# Patient Record
Sex: Female | Born: 1945 | Race: White | Hispanic: No | Marital: Single | Smoking: Never smoker
Health system: Southern US, Community
[De-identification: ages and names within clinical notes are randomized; demographics above are authoritative.]

## PROBLEM LIST (undated history)

## (undated) DIAGNOSIS — M1990 Unspecified osteoarthritis, unspecified site: Secondary | ICD-10-CM

## (undated) DIAGNOSIS — I10 Essential (primary) hypertension: Secondary | ICD-10-CM

## (undated) DIAGNOSIS — E785 Hyperlipidemia, unspecified: Secondary | ICD-10-CM

## (undated) DIAGNOSIS — S83249A Other tear of medial meniscus, current injury, unspecified knee, initial encounter: Secondary | ICD-10-CM

## (undated) HISTORY — PX: NO PAST SURGERIES: SHX2092

---

## 2012-10-01 DIAGNOSIS — E559 Vitamin D deficiency, unspecified: Secondary | ICD-10-CM | POA: Insufficient documentation

## 2013-07-29 DIAGNOSIS — E785 Hyperlipidemia, unspecified: Secondary | ICD-10-CM | POA: Insufficient documentation

## 2013-07-29 DIAGNOSIS — K219 Gastro-esophageal reflux disease without esophagitis: Secondary | ICD-10-CM | POA: Insufficient documentation

## 2013-07-29 DIAGNOSIS — R0989 Other specified symptoms and signs involving the circulatory and respiratory systems: Secondary | ICD-10-CM | POA: Insufficient documentation

## 2013-07-29 DIAGNOSIS — R1013 Epigastric pain: Secondary | ICD-10-CM | POA: Insufficient documentation

## 2013-07-29 DIAGNOSIS — F5101 Primary insomnia: Secondary | ICD-10-CM | POA: Insufficient documentation

## 2014-06-30 DIAGNOSIS — R55 Syncope and collapse: Secondary | ICD-10-CM | POA: Insufficient documentation

## 2014-06-30 DIAGNOSIS — R5383 Other fatigue: Secondary | ICD-10-CM | POA: Insufficient documentation

## 2016-10-24 ENCOUNTER — Ambulatory Visit (INDEPENDENT_AMBULATORY_CARE_PROVIDER_SITE_OTHER): Payer: Medicare Other

## 2016-10-24 ENCOUNTER — Encounter: Payer: Self-pay | Admitting: Osteopathic Medicine

## 2016-10-24 ENCOUNTER — Ambulatory Visit (INDEPENDENT_AMBULATORY_CARE_PROVIDER_SITE_OTHER): Payer: Medicare Other | Admitting: Osteopathic Medicine

## 2016-10-24 VITALS — BP 184/91 | HR 91 | Ht 61.0 in | Wt 160.0 lb

## 2016-10-24 DIAGNOSIS — R140 Abdominal distension (gaseous): Secondary | ICD-10-CM | POA: Diagnosis not present

## 2016-10-24 DIAGNOSIS — I10 Essential (primary) hypertension: Secondary | ICD-10-CM | POA: Diagnosis not present

## 2016-10-24 DIAGNOSIS — R82998 Other abnormal findings in urine: Secondary | ICD-10-CM | POA: Diagnosis not present

## 2016-10-24 DIAGNOSIS — G8929 Other chronic pain: Secondary | ICD-10-CM | POA: Diagnosis not present

## 2016-10-24 DIAGNOSIS — R109 Unspecified abdominal pain: Secondary | ICD-10-CM

## 2016-10-24 DIAGNOSIS — M545 Low back pain, unspecified: Secondary | ICD-10-CM

## 2016-10-24 LAB — CBC
HEMATOCRIT: 37.2 % (ref 35.0–45.0)
Hemoglobin: 12.7 g/dL (ref 11.7–15.5)
MCH: 31.2 pg (ref 27.0–33.0)
MCHC: 34.1 g/dL (ref 32.0–36.0)
MCV: 91.4 fL (ref 80.0–100.0)
MPV: 9.7 fL (ref 7.5–12.5)
PLATELETS: 237 10*3/uL (ref 140–400)
RBC: 4.07 10*6/uL (ref 3.80–5.10)
RDW: 11.6 % (ref 11.0–15.0)
WBC: 6.4 10*3/uL (ref 3.8–10.8)

## 2016-10-24 LAB — COMPLETE METABOLIC PANEL WITH GFR
AG RATIO: 1.4 (calc) (ref 1.0–2.5)
ALT: 30 U/L — AB (ref 6–29)
AST: 23 U/L (ref 10–35)
Albumin: 4.3 g/dL (ref 3.6–5.1)
Alkaline phosphatase (APISO): 82 U/L (ref 33–130)
BILIRUBIN TOTAL: 1.1 mg/dL (ref 0.2–1.2)
BUN: 10 mg/dL (ref 7–25)
CALCIUM: 9.5 mg/dL (ref 8.6–10.4)
CHLORIDE: 104 mmol/L (ref 98–110)
CO2: 30 mmol/L (ref 20–32)
Creat: 0.74 mg/dL (ref 0.50–0.99)
GFR, EST AFRICAN AMERICAN: 96 mL/min/{1.73_m2} (ref >=60)
GFR, EST NON AFRICAN AMERICAN: 83 mL/min/{1.73_m2} (ref >=60)
GLOBULIN: 3 g/dL (ref 1.9–3.7)
Glucose, Bld: 106 mg/dL — ABNORMAL HIGH (ref 65–99)
POTASSIUM: 4.3 mmol/L (ref 3.5–5.3)
SODIUM: 141 mmol/L (ref 135–146)
Total Protein: 7.3 g/dL (ref 6.1–8.1)

## 2016-10-24 LAB — URINALYSIS, ROUTINE W REFLEX MICROSCOPIC
BACTERIA UA: NONE SEEN /HPF
BILIRUBIN URINE: NEGATIVE
Glucose, UA: NEGATIVE
HGB URINE DIPSTICK: NEGATIVE
KETONES UR: NEGATIVE
NITRITE: NEGATIVE
Protein, ur: NEGATIVE
Specific Gravity, Urine: 1.018 (ref 1.001–1.03)
pH: 7 (ref 5.0–8.0)

## 2016-10-24 LAB — LIPID PANEL
Cholesterol: 264 mg/dL — ABNORMAL HIGH (ref <200)
HDL: 52 mg/dL (ref >50)
LDL Cholesterol (Calc): 185 mg/dL (calc) — ABNORMAL HIGH
NON-HDL CHOLESTEROL (CALC): 212 mg/dL — AB (ref <130)
Total CHOL/HDL Ratio: 5.1 (calc) — ABNORMAL HIGH (ref <5.0)
Triglycerides: 137 mg/dL (ref <150)

## 2016-10-24 LAB — TSH: TSH: 1.04 m[IU]/L (ref 0.40–4.50)

## 2016-10-24 LAB — LIPASE: Lipase: 25 U/L (ref 7–60)

## 2016-10-24 IMAGING — DX DG ABDOMEN 2V
3 series · 3 of 3 positions shown · non-contrast
Comparison: None.

CLINICAL DATA: Abdominal cramping, bloating for 3-4 month

EXAM:
ABDOMEN - 2 VIEW

[abdomen erect]
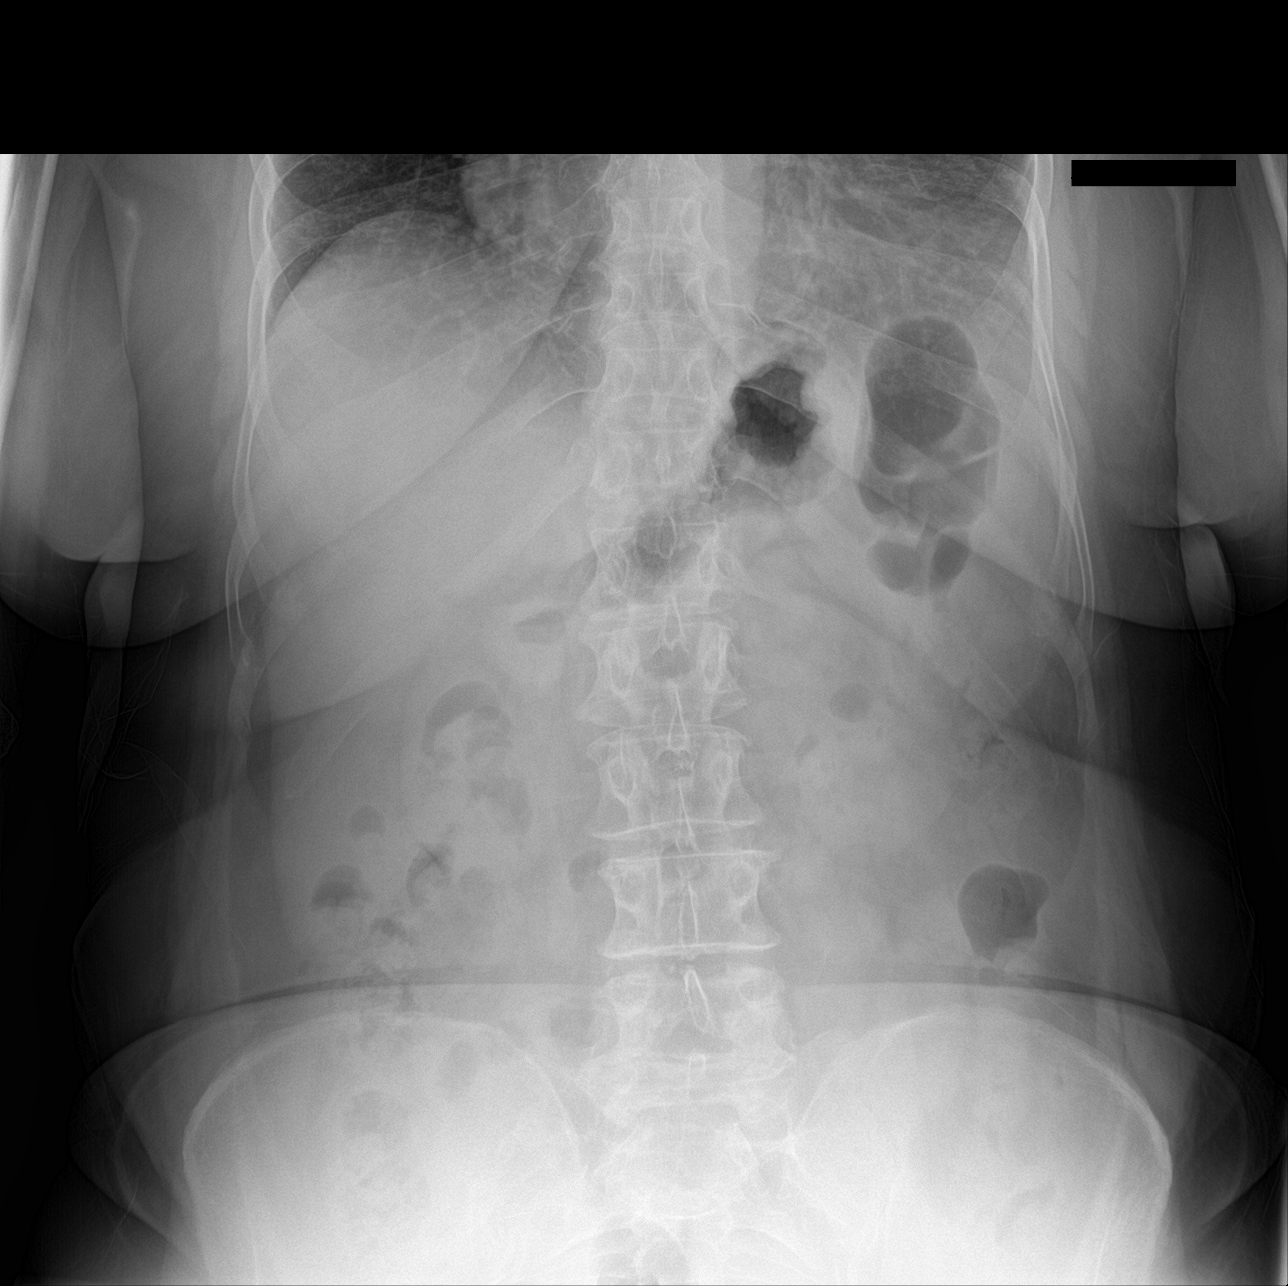

[abdomen supine (1 of 2)]
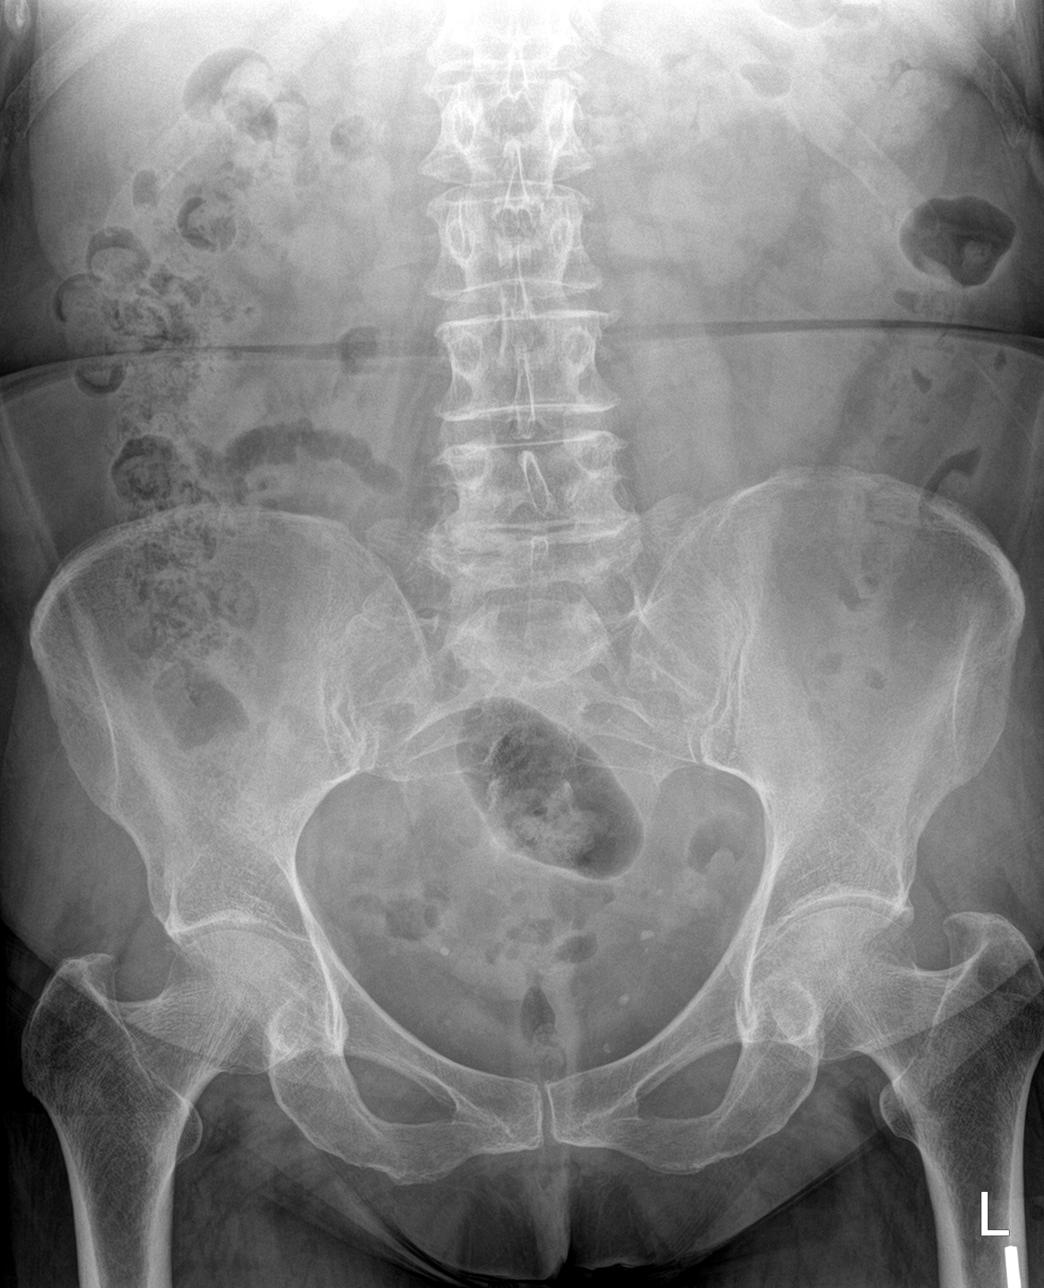

[abdomen supine (2 of 2)]
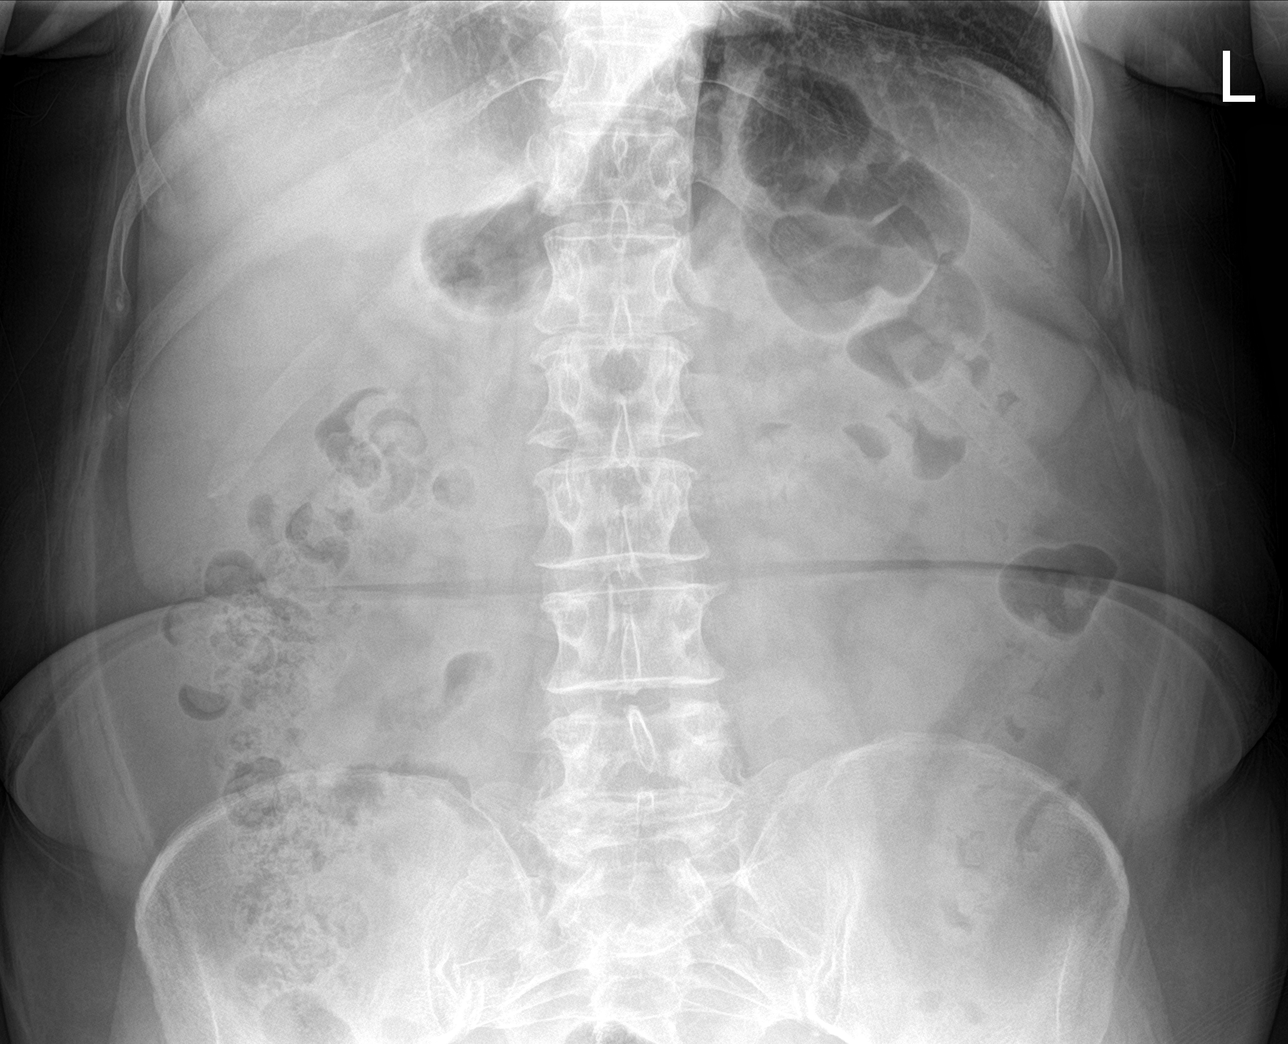

[3 of 3 positions shown; findings below may reference images not displayed]

FINDINGS: Supine and erect views the abdomen show no bowel obstruction. There
is a moderate amount of feces throughout the colon. No free
intraperitoneal air is seen. No opaque calculi are noted. No acute
bony abnormality is seen.
IMPRESSION: 1. No bowel obstruction.  No free air.
2. Moderate amount of feces throughout the colon.

## 2016-10-24 MED ORDER — HYDROCHLOROTHIAZIDE 25 MG PO TABS: 25.0000 mg | ORAL_TABLET | Freq: Every day | ORAL | 1 refills | Status: DC

## 2016-10-24 MED ORDER — MELOXICAM 15 MG PO TABS: 15.0000 mg | ORAL_TABLET | Freq: Every day | ORAL | 2 refills | Status: DC

## 2016-10-24 MED ORDER — DICYCLOMINE HCL 20 MG PO TABS: 20.0000 mg | ORAL_TABLET | Freq: Four times a day (QID) | ORAL | 1 refills | Status: DC

## 2016-10-24 MED ORDER — AMLODIPINE BESYLATE 10 MG PO TABS: 10.0000 mg | ORAL_TABLET | Freq: Every day | ORAL | 1 refills | Status: DC

## 2016-10-24 NOTE — Patient Instructions (Addendum)
Plan:  Back pain: Home exercises and stop ibuprofen, start meloxicam daily  Abdominal pain: We'll get x-ray today, see list below of foods to avoid, trial medication for abdominal spasm as needed. Will consider CT scan if symptoms are not improving  Colon cancer screening: Think about whether you would like to do colonoscopy versus Cologuard testing - you are overdue for routine colon cancer screening. I don't think this has anything to do with your abdominal symptoms now, but we should certainly rule it out  High blood pressure: I'm going to restart medications, we'll plan to recheck blood pressure in 2 weeks. Based on previous records, you are on several different blood pressure medications. I'm going to restart amlodipine and hydrochlorothiazide, start with one half tablet of these 2 medications for the first 3-5 days or so, then increase to full tablet. If you experience dizziness or weakness, please call me right away  Labs today  X-ray today

## 2016-10-24 NOTE — Progress Notes (Signed)
HPI: Jill Bernard is a 71 y.o. female  who [facility], 10/24/16,  for chief complaint of:  Chief Complaint  Patient presents with  . Establish Care    BACK ACHE AND ABDOMINAL PAIN    Concern for mid abdominal pain radiating around into the back. History of chronic back issues, coming go on occasion. Reports cramping type abdominal pain. I'm going on and off, worse over the past few days.  Reports occasional orange stool on toilet tissue but no gross blood in stool or dark black or tarry stool.  Concern for weight gain.  Occasional headaches, history of hypertension, blood pressure has gone untreated for some time. She also has a history of glaucoma andthat she needs follow-up with ophthalmologist but hasn't done this in some time.    Patient is accompanied by sister who assists with history-taking.   Past medical, surgical, social and family history reviewed: There are no active problems to display for this patient.  No past surgical history on file. Social History  Substance Use Topics  . Smoking status: Never Smoker  . Smokeless tobacco: Never Used  . Alcohol use Not on file   Family History  Problem Relation Age of Onset  . Hypertension Father   . Diabetes Maternal Grandmother   . Diabetes Maternal Grandfather      Current medication list and allergy/intolerance information reviewed:   No current outpatient prescriptions on file.   No current facility-administered medications for this visit.    Allergies  Allergen Reactions  . Latex Other (See Comments)    Unknown  . Lisinopril Cough      Review of Systems:  Constitutional:  No  fever, no chills, No recent illness, +unintentional weight gain. No significant fatigue.   HEENT: + headache, no hearing change, No sore throat, No  sinus pressure  Cardiac: No  chest pain, No  pressure, No palpitations, No  Orthopnea  Respiratory:  No  shortness of breath.  No  Cough  Gastrointestinal: +abdominal pain, No  nausea, No  vomiting,  No  blood in stool, No  diarrhea, No  constipation   Musculoskeletal: No new myalgia/arthralgia  Genitourinary: No  incontinence, No  abnormal genital bleeding, No abnormal genital discharge  Skin: No  Rash, No other wounds/concerning lesions  Hem/Onc: +easy bruising  Endocrine: No cold intolerance,  No heat intolerance.  Neurologic: No  weakness, No  dizziness  Psychiatric: No  concerns with depression, No  concerns with anxiety, +sleep problems, No mood problems  Exam:  BP (!) 184/91   Pulse 91   Ht 5\' 1"  (1.549 m)   Wt 160 lb (72.6 kg)   BMI 30.23 kg/m   Constitutional: VS see above. General Appearance: alert, well-developed, well-nourished, NAD  Eyes: Normal lids and conjunctive, non-icteric sclera  Ears, Nose, Mouth, Throat: MMM, Normal external inspection ears/nares/mouth/lips/gums.   Neck: No masses, trachea midline. No thyroid enlargement. No tenderness/mass appreciated. No lymphadenopathy  Respiratory: Normal respiratory effort. no wheeze, no rhonchi, no rales  Cardiovascular: S1/S2 normal, no murmur, no rub/gallop auscultated. RRR. No lower extremity edema.   Gastrointestinal: Mild tender to palpation epigastric region otherwise no tenderness, no masses though habitus limits exam somewhat. No hepatomegaly, no splenomegaly. No hernia appreciated. Bowel sounds normal. Rectal exam deferred.   Musculoskeletal: Gait normal. Straight leg raise negative bilaterally.  Neurological: Normal balance/coordination. No tremor.   Skin: warm, dry, intact. No rash/ulcer.     Psychiatric: Normal judgment/insight. Normal mood  and affect. Oriented x3.    Results for orders placed or performed in visit on 10/24/16 (from the past 72 hour(s))  CBC     Status: None   Collection Time: 10/24/16 11:05 AM  Result Value Ref Range   WBC 6.4 3.8 - 10.8 Thousand/uL   RBC 4.07 3.80 - 5.10 Million/uL   Hemoglobin  12.7 11.7 - 15.5 g/dL   HCT 37.2 35.0 - 45.0 %   MCV 91.4 80.0 - 100.0 fL   MCH 31.2 27.0 - 33.0 pg   MCHC 34.1 32.0 - 36.0 g/dL   RDW 11.6 11.0 - 15.0 %   Platelets 237 140 - 400 Thousand/uL   MPV 9.7 7.5 - 12.5 fL  COMPLETE METABOLIC PANEL WITH GFR     Status: Abnormal   Collection Time: 10/24/16 11:05 AM  Result Value Ref Range   Glucose, Bld 106 (H) 65 - 99 mg/dL    Comment: .            Fasting reference interval . For someone without known diabetes, a glucose value between 100 and 125 mg/dL is consistent with prediabetes and should be confirmed with a follow-up test. .    BUN 10 7 - 25 mg/dL   Creat 0.74 0.50 - 0.99 mg/dL    Comment: For patients >77 years of age, the reference limit for Creatinine is approximately 13% higher for people identified as African-American. .    GFR, Est Non African American 83 > OR = 60 mL/min/1.75m2   GFR, Est African American 96 > OR = 60 mL/min/1.54m2   BUN/Creatinine Ratio NOT APPLICABLE 6 - 22 (calc)   Sodium 141 135 - 146 mmol/L   Potassium 4.3 3.5 - 5.3 mmol/L   Chloride 104 98 - 110 mmol/L   CO2 30 20 - 32 mmol/L   Calcium 9.5 8.6 - 10.4 mg/dL   Total Protein 7.3 6.1 - 8.1 g/dL   Albumin 4.3 3.6 - 5.1 g/dL   Globulin 3.0 1.9 - 3.7 g/dL (calc)   AG Ratio 1.4 1.0 - 2.5 (calc)   Total Bilirubin 1.1 0.2 - 1.2 mg/dL   Alkaline phosphatase (APISO) 82 33 - 130 U/L   AST 23 10 - 35 U/L   ALT 30 (H) 6 - 29 U/L  Lipid panel     Status: Abnormal   Collection Time: 10/24/16 11:05 AM  Result Value Ref Range   Cholesterol 264 (H) <200 mg/dL   HDL 52 >50 mg/dL   Triglycerides 137 <150 mg/dL   LDL Cholesterol (Calc) 185 (H) mg/dL (calc)    Comment: Reference range: <100 . Desirable range <100 mg/dL for primary prevention;   <70 mg/dL for patients with CHD or diabetic patients  with > or = 2 CHD risk factors. Marland Kitchen LDL-C is now calculated using the Martin-Hopkins  calculation, which is a validated novel method providing  better accuracy  than the Friedewald equation in the  estimation of LDL-C.  Cresenciano Genre et al. Annamaria Helling. 4196;222(97): 2061-2068  (http://education.[facility].com/faq/FAQ164)    Total CHOL/HDL Ratio 5.1 (H) <5.0 (calc)   Non-HDL Cholesterol (Calc) 212 (H) <130 mg/dL (calc)    Comment: For patients with diabetes plus 1 major ASCVD risk  factor, treating to a non-HDL-C goal of <100 mg/dL  (LDL-C of <70 mg/dL) is considered a therapeutic  option.   TSH     Status: None   Collection Time: 10/24/16 11:05 AM  Result Value Ref Range   TSH 1.04 0.40 - 4.50 mIU/L  Lipase     Status: None   Collection Time: 10/24/16 11:05 AM  Result Value Ref Range   Lipase 25 7 - 60 U/L  Urinalysis, Routine w reflex microscopic     Status: Abnormal   Collection Time: 10/24/16 11:05 AM  Result Value Ref Range   Color, Urine YELLOW YELLOW   APPearance CLEAR CLEAR   Specific Gravity, Urine 1.018 1.001 - 1.03   pH 7.0 5.0 - 8.0   Glucose, UA NEGATIVE NEGATIVE   Bilirubin Urine NEGATIVE NEGATIVE   Ketones, ur NEGATIVE NEGATIVE   Hgb urine dipstick NEGATIVE NEGATIVE   Protein, ur NEGATIVE NEGATIVE   Nitrite NEGATIVE NEGATIVE   Leukocytes, UA 2+ (A) NEGATIVE   WBC, UA 20-40 (A) 0 - 5 /HPF   RBC / HPF 0-2 0 - 2 /HPF   Squamous Epithelial / LPF 0-5 < OR = 5 /HPF   Bacteria, UA NONE SEEN NONE SEEN /HPF   Hyaline Cast 0-5 (A) NONE SEEN /LPF    Dg Abd 2 Views  Result Date: 10/24/2016 CLINICAL DATA:  Abdominal cramping, bloating for 3-4 month EXAM: ABDOMEN - 2 VIEW COMPARISON:  None. FINDINGS: Supine and erect views the abdomen show no bowel obstruction. There is a moderate amount of feces throughout the colon. No free intraperitoneal air is seen. No opaque calculi are noted. No acute bony abnormality is seen. IMPRESSION: 1. No bowel obstruction.  No free air. 2. Moderate amount of feces throughout the colon. Electronically Signed   By: Ivar Drape M.D.   On: 10/24/2016 12:02     ASSESSMENT/PLAN:   Essential hypertension  - Restart two of her previous medications - Plan: CBC, COMPLETE METABOLIC PANEL WITH GFR, Lipid panel, TSH, amLODipine (NORVASC) 10 MG tablet, hydrochlorothiazide (HYDRODIURIL) 25 MG tablet  Abdominal cramping - No alarm signs for serious infectious process, patient is certainly overdue for colon cancer screening. - Plan: DG Abd 2 Views, CBC, COMPLETE METABOLIC PANEL WITH GFR, Lipase, Urinalysis, Routine w reflex microscopic, dicyclomine (BENTYL) 20 MG tablet  Chronic bilateral low back pain without sciatica  Urine WBC increased - Plan: MICROSCOPIC MESSAGE, ciprofloxacin (CIPRO) 500 MG tablet, Urine Culture    Patient Instructions  Plan:  Back pain: Home exercises and stop ibuprofen, start meloxicam daily  Abdominal pain: We'll get x-ray today, see list below of foods to avoid, trial medication for abdominal spasm as needed. Will consider CT scan if symptoms are not improving  Colon cancer screening: Think about whether you would like to do colonoscopy versus Cologuard testing - you are overdue for routine colon cancer screening. I don't think this has anything to do with your abdominal symptoms now, but we should certainly rule it out  High blood pressure: I'm going to restart medications, we'll plan to recheck blood pressure in 2 weeks. Based on previous records, you are on several different blood pressure medications. I'm going to restart amlodipine and hydrochlorothiazide, start with one half tablet of these 2 medications for the first 3-5 days or so, then increase to full tablet. If you experience dizziness or weakness, please call me right away  Labs today  X-ray today    Visit summary with medication list and pertinent instructions was printed for patient to review. All questions at time of visit were answered - patient instructed to contact office with any additional concerns. ER/RTC precautions were reviewed with the patient. Follow-up plan: Return in about 2 weeks (around  11/07/2016).

## 2016-10-25 DIAGNOSIS — R300 Dysuria: Secondary | ICD-10-CM | POA: Diagnosis not present

## 2016-10-25 DIAGNOSIS — M545 Low back pain: Secondary | ICD-10-CM

## 2016-10-25 DIAGNOSIS — G4700 Insomnia, unspecified: Secondary | ICD-10-CM | POA: Insufficient documentation

## 2016-10-25 DIAGNOSIS — H409 Unspecified glaucoma: Secondary | ICD-10-CM | POA: Insufficient documentation

## 2016-10-25 DIAGNOSIS — M5416 Radiculopathy, lumbar region: Secondary | ICD-10-CM | POA: Insufficient documentation

## 2016-10-25 DIAGNOSIS — E785 Hyperlipidemia, unspecified: Secondary | ICD-10-CM | POA: Insufficient documentation

## 2016-10-25 DIAGNOSIS — G8929 Other chronic pain: Secondary | ICD-10-CM | POA: Insufficient documentation

## 2016-10-25 DIAGNOSIS — I10 Essential (primary) hypertension: Secondary | ICD-10-CM | POA: Insufficient documentation

## 2016-10-25 MED ORDER — CIPROFLOXACIN HCL 500 MG PO TABS: 500.0000 mg | ORAL_TABLET | Freq: Two times a day (BID) | ORAL | 0 refills | Status: DC

## 2016-10-26 LAB — URINE CULTURE
MICRO NUMBER:: 81191088
RESULT: NO GROWTH
SPECIMEN QUALITY:: ADEQUATE

## 2016-11-07 ENCOUNTER — Ambulatory Visit: Payer: Medicare Other | Admitting: Osteopathic Medicine

## 2016-11-14 ENCOUNTER — Ambulatory Visit (INDEPENDENT_AMBULATORY_CARE_PROVIDER_SITE_OTHER): Payer: Medicare Other | Admitting: Osteopathic Medicine

## 2016-11-14 ENCOUNTER — Encounter: Payer: Self-pay | Admitting: Osteopathic Medicine

## 2016-11-14 VITALS — BP 155/75 | HR 83 | Ht 62.0 in | Wt 163.0 lb

## 2016-11-14 DIAGNOSIS — I10 Essential (primary) hypertension: Secondary | ICD-10-CM

## 2016-11-14 MED ORDER — OLMESARTAN MEDOXOMIL 20 MG PO TABS: 20.0000 mg | ORAL_TABLET | Freq: Every day | ORAL | 1 refills | Status: DC

## 2016-11-14 NOTE — Progress Notes (Signed)
HPI: Jill Bernard is a 71 y.o. female  who presents to [name] today, 11/14/16,  for chief complaint of: recheck blood pressure   At last visit 10/24/16: Occasional headaches, history of hypertension, blood pressure has gone untreated for some time. She also has a history of glaucoma andthat she needs follow-up with ophthalmologist but hasn't done this in some time. We restarted BP medications amlodipine 10 and HCT 25. Headaches a bit better, (+)stress w/ recent death of sister, she has appt with ophtho soon to recheck on glaucoma    Past medical, surgical, social and family history reviewed: Patient Active Problem List   Diagnosis Date Noted  . Essential hypertension 10/25/2016  . Chronic bilateral low back pain without sciatica 10/25/2016  . Glaucoma 10/25/2016  . Hyperlipidemia with target low density lipoprotein (LDL) cholesterol less than 130 mg/dL 10/25/2016  . Insomnia, unspecified 10/25/2016  . Fatigue 06/30/2014  . Syncope 06/30/2014  . Bruit of right carotid artery 07/29/2013  . Dyslipidemia 07/29/2013  . Epigastric pain 07/29/2013  . Gastroesophageal reflux disease without esophagitis 07/29/2013  . Primary insomnia 07/29/2013  . Vitamin D deficiency 10/01/2012   No past surgical history on file. Social History   Tobacco Use  . Smoking status: Never Smoker  . Smokeless tobacco: Never Used  Substance Use Topics  . Alcohol use: Not on file   Family History  Problem Relation Age of Onset  . Hypertension Father   . Diabetes Maternal Grandmother   . Diabetes Maternal Grandfather      Current medication list and allergy/intolerance information reviewed:   Current Outpatient Medications  Medication Sig Dispense Refill  . amLODipine (NORVASC) 10 MG tablet Take 1 tablet (10 mg total) by mouth daily. 30 tablet 1  . dicyclomine (BENTYL) 20 MG tablet Take 1 tablet (20 mg total) by mouth every 6 (six) hours. As needed for abdominal  spasm 20 tablet 1  . hydrochlorothiazide (HYDRODIURIL) 25 MG tablet Take 1 tablet (25 mg total) by mouth daily. 30 tablet 1  . meloxicam (MOBIC) 15 MG tablet Take 1 tablet (15 mg total) by mouth daily. As needed for aches/pain 30 tablet 2   No current facility-administered medications for this visit.    Allergies  Allergen Reactions  . Latex Other (See Comments)    Unknown  . Lisinopril Cough      Review of Systems:  Constitutional:  No  fever, no chills  HEENT: + headache, no hearing change, No sore throat, No  sinus pressure  Cardiac: No  chest pain, No  pressure, No palpitations  Respiratory:  No  shortness of breath. No  Cough   Exam:  BP (!) 155/75   Pulse 83   Ht 5\' 2"  (1.575 m)   Wt 163 lb (73.9 kg)   BMI 29.81 kg/m   Constitutional: VS see above. General Appearance: alert, well-developed, well-nourished, NAD  Eyes: Normal lids and conjunctive, non-icteric sclera  Ears, Nose, Mouth, Throat: MMM, Normal external inspection ears/nares/mouth/lips/gums.   Neck: No masses, trachea midline.   Respiratory: Normal respiratory effort. no wheeze, no rhonchi, no rales  Cardiovascular: S1/S2 normal, no murmur, no rub/gallop auscultated. RRR. No lower extremity edema.   Musculoskeletal: Gait normal.   Neurological: Normal balance/coordination. No tremor.   Skin: warm, dry, intact. No rash/ulcer.     Psychiatric: Normal judgment/insight. Normal mood and affect. Oriented x3.    ASSESSMENT/PLAN:   Essential hypertension - add olmesartan, recheck in 2 weeks, recheck labs that visit  Outpatient Encounter Medications as of 11/14/2016  Medication Sig  . amLODipine (NORVASC) 10 MG tablet Take 1 tablet (10 mg total) by mouth daily.  Marland Kitchen dicyclomine (BENTYL) 20 MG tablet Take 1 tablet (20 mg total) by mouth every 6 (six) hours. As needed for abdominal spasm  . hydrochlorothiazide (HYDRODIURIL) 25 MG tablet Take 1 tablet (25 mg total) by mouth daily.  . meloxicam  (MOBIC) 15 MG tablet Take 1 tablet (15 mg total) by mouth daily. As needed for aches/pain  . olmesartan (BENICAR) 20 MG tablet Take 1 tablet (20 mg total) daily by mouth.  . [DISCONTINUED] ciprofloxacin (CIPRO) 500 MG tablet Take 1 tablet (500 mg total) by mouth 2 (two) times daily. For 1 week (Patient not taking: Reported on 11/14/2016)  . [DISCONTINUED] olmesartan (BENICAR) 20 MG tablet Take 1 tablet (20 mg total) daily by mouth.   No facility-administered encounter medications on file as of 11/14/2016.      Visit summary with medication list and pertinent instructions was printed for patient to review. All questions at time of visit were answered - patient instructed to contact office with any additional concerns. ER/RTC precautions were reviewed with the patient. Follow-up plan: Return in about 2 weeks (around 11/28/2016) for recheck blood pressure on added medictaion .

## 2016-11-28 ENCOUNTER — Ambulatory Visit: Payer: Medicare Other | Admitting: Osteopathic Medicine

## 2017-10-23 ENCOUNTER — Telehealth: Payer: Self-pay | Admitting: Osteopathic Medicine

## 2017-10-23 NOTE — Telephone Encounter (Signed)
Patient called and left a VM stating that she was needing her BP medication. I advised her that she would need to come in for an office visit and she voices understanding.

## 2018-02-05 ENCOUNTER — Ambulatory Visit: Payer: Medicare Other | Admitting: Osteopathic Medicine

## 2018-03-13 ENCOUNTER — Encounter: Payer: Self-pay | Admitting: Osteopathic Medicine

## 2018-03-13 ENCOUNTER — Ambulatory Visit (INDEPENDENT_AMBULATORY_CARE_PROVIDER_SITE_OTHER): Payer: Medicare Other | Admitting: Osteopathic Medicine

## 2018-03-13 ENCOUNTER — Other Ambulatory Visit: Payer: Self-pay

## 2018-03-13 VITALS — BP 185/88 | HR 99 | Temp 98.3°F | Wt 147.6 lb

## 2018-03-13 DIAGNOSIS — I10 Essential (primary) hypertension: Secondary | ICD-10-CM

## 2018-03-13 MED ORDER — HYDROCHLOROTHIAZIDE 25 MG PO TABS: 25.0000 mg | ORAL_TABLET | Freq: Every day | ORAL | 1 refills | Status: DC

## 2018-03-13 MED ORDER — AMLODIPINE BESYLATE 10 MG PO TABS: 10.0000 mg | ORAL_TABLET | Freq: Every day | ORAL | 1 refills | Status: DC

## 2018-03-13 NOTE — Progress Notes (Signed)
HPI: Jill Bernard is a 73 y.o. female who  has no past medical history on file.  she presents to [facility] today, 03/13/18,  for chief complaint of:  HTN, headache   249/132 on intake 15 mins sitting 185/88 Headache past week or two  Previous issues with high blood pressure.  I have not seen the patient since 11/14/2016, at which point pressure was not at goal but was definitely improved on repeat in 10+ hydrochloride side 25.  We added olmesartan 20 mg to her list but the patient was lost to follow-up after that.  Patient reports headache over the past couple of weeks.  Is a little bit better today.  Throbbing, bilateral.  No vision change.  No dizziness.  No chest pain, no shortness of breath.    BP Readings from Last 3 Encounters:  03/13/18 (!) 249/132  11/14/16 (!) 155/75  10/24/16 (!) 184/91      At today's visit 03/13/18 ... PMH, PSH, FH reviewed and updated as needed.  Current medication list and allergy/intolerance hx reviewed and updated as needed. (See remainder of HPI, ROS, Phys Exam below)          ASSESSMENT/PLAN: The primary encounter diagnosis was Uncontrolled hypertension. A diagnosis of Essential hypertension was also pertinent to this visit.   Orders Placed This Encounter  Procedures  . CBC  . COMPLETE METABOLIC PANEL WITH GFR  . TSH  . Lipid Panel w/reflex Direct LDL     Meds ordered this encounter  Medications  . amLODipine (NORVASC) 10 MG tablet    Sig: Take 1 tablet (10 mg total) by mouth daily.    Dispense:  30 tablet    Refill:  1  . hydrochlorothiazide (HYDRODIURIL) 25 MG tablet    Sig: Take 1 tablet (25 mg total) by mouth daily.    Dispense:  30 tablet    Refill:  1    Patient Instructions  Plan:  We will get some blood work today to ensure medication safety  Were going to go ahead and restart the hydrochlorothiazide and the amlodipine as long as blood work is looking okay.  We will  call you tomorrow morning and let you know.  Plan to see me again next week to recheck the blood pressure.  I expect that we are going to have to adjust the medications over the next couple of visits but hopefully we can get you on a good regimen.      Follow-up plan: Return in about 1 week (around 03/20/2018) for recheck BP w/ Dr Sheppard Coil - see [place] sooner if needed .                                                 ################################################# ################################################# ################################################# #################################################    No outpatient medications have been marked as taking for the 03/13/18 encounter (Office Visit) with Emeterio Reeve, DO.    Allergies  Allergen Reactions  . Latex Other (See Comments)    Unknown  . Lisinopril Cough       Review of Systems:  Constitutional: No recent illness  HEENT: +headache, no vision change  Cardiac: No  chest pain, No  pressure  Respiratory:  No  shortness of breath. No  Cough  Gastrointestinal: No  abdominal pain, no change on bowel habits  Musculoskeletal: No new  myalgia/arthralgia  Skin: No  Rash  Neurologic: No  weakness, No  Dizziness  Psychiatric: No  concerns with depression, +concerns with anxiety  Exam:   BP (!) 185/88 (BP Location: Left Arm, Patient Position: Sitting, Cuff Size: Normal)   Pulse 99   Temp 98.3 F (36.8 C) (Oral)   Wt 147 lb 9.6 oz (67 kg)   BMI 27.00 kg/m   Constitutional: VS see above. General Appearance: alert, well-developed, well-nourished, NAD  Eyes: Normal lids and conjunctive, non-icteric sclera  Ears, Nose, Mouth, Throat: MMM, Normal external inspection ears/nares/mouth/lips/gums.  Neck: No masses, trachea midline.   Respiratory: Normal respiratory effort. no wheeze, no rhonchi, no rales  Cardiovascular: S1/S2 normal, no murmur, no  rub/gallop auscultated. RRR.  No lower extremity edema  Musculoskeletal: Gait normal. Symmetric and independent movement of all extremities  Neurological: Normal balance/coordination. No tremor.  Skin: warm, dry, intact.   Psychiatric: Normal judgment/insight. Normal mood and affect. Oriented x3.       Visit summary with medication list and pertinent instructions was printed for patient to review, patient was advised to alert [place] if any updates are needed. All questions at time of visit were answered - patient instructed to contact office with any additional concerns. ER/RTC precautions were reviewed with the patient and understanding verbalized.     Please note: voice recognition software was used to produce this document, and typos may escape review. Please contact Dr. Sheppard Coil for any needed clarifications.    Follow up plan: Return in about 1 week (around 03/20/2018) for recheck BP w/ Dr Sheppard Coil - see [place] sooner if needed .

## 2018-03-13 NOTE — Patient Instructions (Addendum)
Plan:  We will get some blood work today to ensure medication safety  Were going to go ahead and restart the hydrochlorothiazide and the amlodipine as long as blood work is looking okay.  We will call you tomorrow morning and let you know.  Plan to see me again next week to recheck the blood pressure.  I expect that we are going to have to adjust the medications over the next couple of visits but hopefully we can get you on a good regimen.

## 2018-03-14 LAB — LIPID PANEL W/REFLEX DIRECT LDL
CHOLESTEROL: 263 mg/dL — AB (ref <200)
HDL: 50 mg/dL (ref >=50)
LDL CHOLESTEROL (CALC): 176 mg/dL — AB
Non-HDL Cholesterol (Calc): 213 mg/dL (calc) — ABNORMAL HIGH (ref <130)
TRIGLYCERIDES: 209 mg/dL — AB (ref <150)
Total CHOL/HDL Ratio: 5.3 (calc) — ABNORMAL HIGH (ref <5.0)

## 2018-03-14 LAB — TSH: TSH: 1.19 mIU/L (ref 0.40–4.50)

## 2018-03-14 LAB — COMPLETE METABOLIC PANEL WITH GFR
AG Ratio: 1.3 (calc) (ref 1.0–2.5)
ALT: 16 U/L (ref 6–29)
AST: 13 U/L (ref 10–35)
Albumin: 4.1 g/dL (ref 3.6–5.1)
Alkaline phosphatase (APISO): 80 U/L (ref 37–153)
BUN: 12 mg/dL (ref 7–25)
CALCIUM: 9.4 mg/dL (ref 8.6–10.4)
CO2: 29 mmol/L (ref 20–32)
CREATININE: 0.71 mg/dL (ref 0.60–0.93)
Chloride: 103 mmol/L (ref 98–110)
GFR, Est African American: 100 mL/min/{1.73_m2} (ref >=60)
GFR, Est Non African American: 86 mL/min/{1.73_m2} (ref >=60)
GLOBULIN: 3.1 g/dL (ref 1.9–3.7)
GLUCOSE: 111 mg/dL — AB (ref 65–99)
Potassium: 3.8 mmol/L (ref 3.5–5.3)
Sodium: 140 mmol/L (ref 135–146)
Total Bilirubin: 0.7 mg/dL (ref 0.2–1.2)
Total Protein: 7.2 g/dL (ref 6.1–8.1)

## 2018-03-14 LAB — CBC
HEMATOCRIT: 36.7 % (ref 35.0–45.0)
Hemoglobin: 12.6 g/dL (ref 11.7–15.5)
MCH: 31.6 pg (ref 27.0–33.0)
MCHC: 34.3 g/dL (ref 32.0–36.0)
MCV: 92 fL (ref 80.0–100.0)
MPV: 10.1 fL (ref 7.5–12.5)
Platelets: 248 10*3/uL (ref 140–400)
RBC: 3.99 10*6/uL (ref 3.80–5.10)
RDW: 11.8 % (ref 11.0–15.0)
WBC: 6.9 10*3/uL (ref 3.8–10.8)

## 2018-03-20 ENCOUNTER — Ambulatory Visit: Payer: Medicare Other | Admitting: Osteopathic Medicine

## 2018-04-16 ENCOUNTER — Telehealth: Payer: Self-pay

## 2018-04-16 NOTE — Telephone Encounter (Signed)
Amanat called and left a message asking about a follow up appointment. I called and left a message advising patient to call back and schedule an appointment.

## 2018-05-08 ENCOUNTER — Ambulatory Visit: Payer: Medicare Other | Admitting: Sports Medicine

## 2018-05-14 ENCOUNTER — Other Ambulatory Visit: Payer: Self-pay | Admitting: Osteopathic Medicine

## 2018-05-14 ENCOUNTER — Ambulatory Visit: Payer: Medicare Other | Admitting: Osteopathic Medicine

## 2018-05-14 ENCOUNTER — Encounter: Payer: Self-pay | Admitting: Osteopathic Medicine

## 2018-05-14 DIAGNOSIS — I10 Essential (primary) hypertension: Secondary | ICD-10-CM

## 2018-05-14 MED ORDER — HYDROCHLOROTHIAZIDE 25 MG PO TABS: 25.0000 mg | ORAL_TABLET | Freq: Every day | ORAL | 0 refills | Status: DC

## 2018-05-14 MED ORDER — AMLODIPINE BESYLATE 10 MG PO TABS: 10.0000 mg | ORAL_TABLET | Freq: Every day | ORAL | 0 refills | Status: DC

## 2018-05-14 NOTE — Telephone Encounter (Signed)
Refill sent to last until upcoming OV

## 2018-05-14 NOTE — Telephone Encounter (Signed)
Patient needs a refill on amLODipine (NORVASC) 10 MG tablet [[id number]] and hydrochlorothiazide (HYDRODIURIL) 25 MG tablet [[id number]] sent to  [facility], [place] - [address] AT [place] ([address] Please advise.

## 2018-05-14 NOTE — Telephone Encounter (Signed)
Error

## 2018-05-14 NOTE — Addendum Note (Signed)
Addended by: Alena Bills R on: 05/14/2018 03:03 PM   Modules accepted: Orders

## 2018-05-21 ENCOUNTER — Encounter: Payer: Self-pay | Admitting: Osteopathic Medicine

## 2018-05-21 ENCOUNTER — Ambulatory Visit (INDEPENDENT_AMBULATORY_CARE_PROVIDER_SITE_OTHER): Payer: Medicare Other | Admitting: Osteopathic Medicine

## 2018-05-21 VITALS — BP 158/68 | HR 89 | Temp 98.5°F | Wt 145.1 lb

## 2018-05-21 DIAGNOSIS — R42 Dizziness and giddiness: Secondary | ICD-10-CM

## 2018-05-21 DIAGNOSIS — M79602 Pain in left arm: Secondary | ICD-10-CM

## 2018-05-21 DIAGNOSIS — R55 Syncope and collapse: Secondary | ICD-10-CM

## 2018-05-21 NOTE — Progress Notes (Signed)
HPI: Jill Bernard is a 73 y.o. female who  has no past medical history on file.  she presents to [facility] today, 05/21/18,  for chief complaint of:  Dizziness  HTN  Dizziness . Quality: More lightheadedness as opposed to spinning/vertigo . Severity: . Duration: Ongoing altogether for about a month . Timing: Episodes maybe 2/week, last a few seconds and recover spontaneously.  She can typically feel these coming on, has not noticed any particular triggers, time of day, activities which exacerbate  . Modifying factors: sitting and resting resolves the episodes  . Denies loss of consciousness, chest pain, shortness of breath, palpitations.  Left arm concern  Concerned about left arm pain, had 3-4 episodes over the past month of a few seconds of what feels like "shooting numbness" down the arm which resolved spontaneously.  Had a similar issue with left leg as well.  No associated chest pain, but these episodes were not associated with the dizziness  Hypertension  Blood pressure is improved today, patient brings home blood pressure readings, some of which are pretty close to goal and others are definitely not.        At today's visit 05/21/18 ... PMH, PSH, FH reviewed and updated as needed.  Current medication list and allergy/intolerance hx reviewed and updated as needed. (See remainder of HPI, ROS, Phys Exam below)   Orthostatic vital signs negative.  EKG interpretation: Rate: 83 Rhythm: sinus No ST/T changes concerning for acute ischemia/infarct  Previous EKG no tracings available          ASSESSMENT/PLAN: The primary encounter diagnosis was Episodic lightheadedness. Diagnoses of Left arm pain and Postural dizziness with presyncope were also pertinent to this visit.  Intermittent lightheadedness/presyncopal type sensation.  No obvious triggers.  She describes orthostatic type symptoms but it does not seem to be related to  position.  Would consider some kind of dysautonomia, possible cardiac/neurological issue.  No red flag symptoms for ACS, EKG today looks okay, blood pressure is improved.  Patient would like to hold off on blood pressure medication adjustments and I would tend to agree though of course concerning blood pressure being above goal but definitely better than it was.  Would have a low threshold for cardiology/neurology referral. L Arm pain sounds more radicular in nature, as does lower leg pain  Orders Placed This Encounter  Procedures  . US Carotid Duplex Bilateral  . EKG 12-Lead  . ECHOCARDIOGRAM COMPLETE     No orders of the defined types were placed in this encounter.   Patient Instructions  I think these spells might be blood pressure dropping or something compromising blood flow to the brain. I'd like to get an ultrasound of the heart and your carotic arteries to evaluate how well your heart is pumping, and to evaluate for any blockage in the blood vessels supplying the brain. EKG looks ok and recent labs were ok so I don't strongly think this is heart related.   If you have one of these spells that doesn't resolve on its own, or if you lose consciousness, please seek emergency medical attention!   Dizziness can be tough to diagnose, especially if complicating factors like the unusual sensation you're experiencing in the arm. We might consider further workup with specialists and other testing / imaging depending on how you're doing.   Will work on getting blood pressure better controlled, depending on the other test results. I'm nervous to go up on your blood pressure medicines right  now since some of your numbers are okay and I don't want to drop your blood pressure more.        Follow-up plan: Return in about 2 weeks (around 06/04/2018) for review results from cardiac testing, recheck dizzziness - see me sooner if needed .                                                  ################################################# ################################################# ################################################# #################################################    Current Meds  Medication Sig  . amLODipine (NORVASC) 10 MG tablet Take 1 tablet (10 mg total) by mouth daily.  Marland Kitchen dicyclomine (BENTYL) 20 MG tablet Take 1 tablet (20 mg total) by mouth every 6 (six) hours. As needed for abdominal spasm  . hydrochlorothiazide (HYDRODIURIL) 25 MG tablet Take 1 tablet (25 mg total) by mouth daily.  . meloxicam (MOBIC) 15 MG tablet Take 1 tablet (15 mg total) by mouth daily. As needed for aches/pain  . olmesartan (BENICAR) 20 MG tablet Take 1 tablet (20 mg total) daily by mouth.    Allergies  Allergen Reactions  . Latex Other (See Comments)    Unknown  . Lisinopril Cough       Review of Systems:  Constitutional: No recent illness  HEENT: +headache, no vision change  Cardiac: No  chest pain, No  pressure, No palpitations  Respiratory:  No  shortness of breath. No  Cough  Gastrointestinal: No  abdominal pain, no change on bowel habits  Musculoskeletal: +new myalgia/arthralgia  Skin: No  Rash  Hem/Onc: No  easy bruising/bleeding, No  abnormal lumps/bumps  Neurologic: No  weakness, +Dizziness  Psychiatric: No  concerns with depression, No  concerns with anxiety  Exam:  BP (!) 158/68 (BP Location: Left Arm, Patient Position: Sitting, Cuff Size: Normal)   Pulse 89   Temp 98.5 F (36.9 C) (Oral)   Wt 145 lb 1.6 oz (65.8 kg)   SpO2 99%   BMI 26.54 kg/m   Constitutional: VS see above. General Appearance: alert, well-developed, well-nourished, NAD  Eyes: Normal lids and conjunctive, non-icteric sclera  Ears, Nose, Mouth, Throat: MMM, Normal external inspection ears/nares/mouth/lips/gums.  Neck: No masses, trachea midline.   Respiratory: Normal respiratory effort. no wheeze, no rhonchi, no rales   Cardiovascular: S1/S2 normal, no murmur, no rub/gallop auscultated. RRR.   Musculoskeletal: Gait normal. Symmetric and independent movement of all extremities  Abdominal: non-tender, non-distended, no appreciable organomegaly, neg Murphy's, BS WNLx4  Neurological: Normal balance/coordination. No tremor.  EOMI, PERRLA, normal finger-to-nose, normal strength/sensation in all 4 extremities, left lower extremity is slightly weaker  Skin: warm, dry, intact.   Psychiatric: Normal judgment/insight. Normal mood and affect. Oriented x3.       Visit summary with medication list and pertinent instructions was printed for patient to review, patient was advised to alert [place] if any updates are needed. All questions at time of visit were answered - patient instructed to contact office with any additional concerns. ER/RTC precautions were reviewed with the patient and understanding verbalized.     Please note: voice recognition software was used to produce this document, and typos may escape review. Please contact Dr. Sheppard Coil for any needed clarifications.    Follow up plan: Return in about 2 weeks (around 06/04/2018) for review results from cardiac testing, recheck dizzziness - see me sooner if needed .

## 2018-05-21 NOTE — Patient Instructions (Addendum)
I think these spells might be blood pressure dropping or something compromising blood flow to the brain. I'd like to get an ultrasound of the heart and your carotic arteries to evaluate how well your heart is pumping, and to evaluate for any blockage in the blood vessels supplying the brain. EKG looks ok and recent labs were ok so I don't strongly think this is heart related.   If you have one of these spells that doesn't resolve on its own, or if you lose consciousness, please seek emergency medical attention!   Dizziness can be tough to diagnose, especially if complicating factors like the unusual sensation you're experiencing in the arm. We might consider further workup with specialists and other testing / imaging depending on how you're doing.   Will work on getting blood pressure better controlled, depending on the other test results. I'm nervous to go up on your blood pressure medicines right now since some of your numbers are okay and I don't want to drop your blood pressure more.

## 2018-05-31 ENCOUNTER — Telehealth: Payer: Self-pay | Admitting: Osteopathic Medicine

## 2018-05-31 NOTE — Telephone Encounter (Signed)
Called and scheduled Echo at [facility]

## 2018-05-31 NOTE — Telephone Encounter (Signed)
Ready for scheduling.   Case Number: [id number] Status: This member's benefit plan did not require a prior authorization for this request.

## 2018-06-04 ENCOUNTER — Telehealth: Payer: Self-pay

## 2018-06-04 ENCOUNTER — Ambulatory Visit: Payer: Medicare Other | Admitting: Osteopathic Medicine

## 2018-06-04 NOTE — Telephone Encounter (Signed)
Pt left a vm msg stating no one has called her for scheduling of Echo in [facility]. Requesting a call back regarding referral.

## 2018-06-05 ENCOUNTER — Other Ambulatory Visit: Payer: Self-pay

## 2018-06-05 DIAGNOSIS — M79602 Pain in left arm: Secondary | ICD-10-CM

## 2018-06-05 DIAGNOSIS — R55 Syncope and collapse: Secondary | ICD-10-CM

## 2018-06-05 DIAGNOSIS — R42 Dizziness and giddiness: Secondary | ICD-10-CM

## 2018-06-05 NOTE — Telephone Encounter (Signed)
I called patient yesterday and was unable to leave a message I am calling Echo's again today - CF

## 2018-06-05 NOTE — Telephone Encounter (Signed)
Noted! Thank you

## 2018-06-07 ENCOUNTER — Other Ambulatory Visit: Payer: Self-pay

## 2018-06-07 ENCOUNTER — Ambulatory Visit (HOSPITAL_BASED_OUTPATIENT_CLINIC_OR_DEPARTMENT_OTHER)
Admission: RE | Admit: 2018-06-07 | Discharge: 2018-06-07 | Disposition: A | Discharge status: Home / Self Care | Payer: Medicare Other | Source: Ambulatory Visit

## 2018-06-07 ENCOUNTER — Ambulatory Visit (HOSPITAL_BASED_OUTPATIENT_CLINIC_OR_DEPARTMENT_OTHER)
Admission: RE | Admit: 2018-06-07 | Discharge: 2018-06-07 | Disposition: A | Discharge status: Home / Self Care | Payer: Medicare Other | Source: Ambulatory Visit | Attending: Osteopathic Medicine | Admitting: Osteopathic Medicine

## 2018-06-07 DIAGNOSIS — R42 Dizziness and giddiness: Secondary | ICD-10-CM | POA: Insufficient documentation

## 2018-06-07 DIAGNOSIS — R55 Syncope and collapse: Secondary | ICD-10-CM | POA: Diagnosis not present

## 2018-06-07 DIAGNOSIS — M79602 Pain in left arm: Secondary | ICD-10-CM | POA: Insufficient documentation

## 2018-06-07 NOTE — Progress Notes (Signed)
Carotid artery duplex has been completed. Results can be found under CV Proc when doctor completes the report.  Jill Bernard

## 2018-06-07 NOTE — Progress Notes (Signed)
  Echocardiogram 2D Echocardiogram and  has been performed.  Jill Bernard M 06/07/2018, 11:22 AM

## 2018-07-12 ENCOUNTER — Telehealth: Payer: Self-pay

## 2018-07-12 NOTE — Telephone Encounter (Signed)
Would agree with triage assessment and recommendations, certainly could be TIA/evolving stroke

## 2018-07-12 NOTE — Telephone Encounter (Signed)
Patient called triage, she just had an episode where the whole side of her left body when limp and numb, unable to move/ patient's son was there and he was able to get patient into the house and seated. Patient reports some feeling is coming back but not much, states she thought she could feel the episode coming on. Patient's BP is 177/91 pulse 74. Patient denied any slurred speech and was able to communicate with me on the phone, although slightly hard to understand.   Advised pt to have someone take her to nearest ER to be evaluated for stroke. Patient agreeable, having someone coming to pick her up and take her now.   FYI to PCP

## 2018-08-01 ENCOUNTER — Other Ambulatory Visit: Payer: Self-pay | Admitting: Osteopathic Medicine

## 2018-08-01 DIAGNOSIS — I10 Essential (primary) hypertension: Secondary | ICD-10-CM

## 2018-08-01 NOTE — Telephone Encounter (Signed)
Forwarding medication refill request to PCP for review. 

## 2018-08-27 ENCOUNTER — Other Ambulatory Visit: Payer: Self-pay

## 2018-08-27 ENCOUNTER — Ambulatory Visit (INDEPENDENT_AMBULATORY_CARE_PROVIDER_SITE_OTHER): Payer: Medicare Other | Admitting: Osteopathic Medicine

## 2018-08-27 ENCOUNTER — Encounter: Payer: Self-pay | Admitting: Osteopathic Medicine

## 2018-08-27 VITALS — BP 152/56 | HR 82 | Temp 98.8°F | Wt 148.1 lb

## 2018-08-27 DIAGNOSIS — G4701 Insomnia due to medical condition: Secondary | ICD-10-CM

## 2018-08-27 DIAGNOSIS — I10 Essential (primary) hypertension: Secondary | ICD-10-CM | POA: Diagnosis not present

## 2018-08-27 MED ORDER — HYDROCHLOROTHIAZIDE 25 MG PO TABS: 25.0000 mg | ORAL_TABLET | Freq: Every day | ORAL | 1 refills | Status: DC

## 2018-08-27 MED ORDER — GABAPENTIN 100 MG PO CAPS: 100.0000 mg | ORAL_CAPSULE | Freq: Every day | ORAL | 0 refills | Status: DC

## 2018-08-27 MED ORDER — AMLODIPINE BESYLATE 10 MG PO TABS: 10.0000 mg | ORAL_TABLET | Freq: Every day | ORAL | 1 refills | Status: DC

## 2018-08-27 NOTE — Progress Notes (Signed)
HPI: Jill Bernard is a 73 y.o. female who  has no past medical history on file.  she presents to [facility] today, 08/27/18,  for chief complaint of:  Handicap parking placard desired Insomnia Checkup on blood pressure  Patient states she forgot her blood pressure medications this morning.  Blood pressure at home reportedly systolic in the Q000111Q, diastolic in the Q000111Q or [id number] most of the time.  No headache, no dizziness.  Reports arthritis has been keeping her up at night.  Muscle aches are sometimes waking her up in the night.  No recent injury.  Has been taking Advil and this helps somewhat.  Reports difficulty walking long distances, would like disability placard for handicap parking space       At today's visit 08/27/18 ... PMH, PSH, FH reviewed and updated as needed.  Current medication list and allergy/intolerance hx reviewed and updated as needed. (See remainder of HPI, ROS, Phys Exam below)   No results found.  No results found for this or any previous visit (from the past 72 hour(s)).  BP Readings from Last 3 Encounters:  08/27/18 (!) 152/56  05/21/18 (!) 158/68  03/13/18 (!) 185/88         ASSESSMENT/PLAN: The primary encounter diagnosis was Insomnia due to medical condition. A diagnosis of Essential hypertension was also pertinent to this visit.   No orders of the defined types were placed in this encounter.    Meds ordered this encounter  Medications  . amLODipine (NORVASC) 10 MG tablet    Sig: Take 1 tablet (10 mg total) by mouth daily.    Dispense:  90 tablet    Refill:  1  . hydrochlorothiazide (HYDRODIURIL) 25 MG tablet    Sig: Take 1 tablet (25 mg total) by mouth daily.    Dispense:  90 tablet    Refill:  1  . gabapentin (NEURONTIN) 100 MG capsule    Sig: Take 1-3 capsules (100-300 mg total) by mouth at bedtime.    Dispense:  90 capsule    Refill:  0    Patient Instructions  Plan:  See attached for  handicap form  We will try gabapentin, can take this for aches and pains and may also help with sleep.  Can try starting with 1 capsule and increase to 3 if 1 or 2 is not helping.  Refill sent for blood pressure medication  We will plan to follow-up in 6 months for Medicare wellness visit, routine blood work and annual physical.  Please let me know sooner than that if there is anything else you need!       Follow-up plan: Return in about 6 months (around 02/27/2019) for Medicare wellness visit with Maudie Mercury, annual physical/labs with Dr. Sheppard Coil 1 to 2 weeks after that.                                                 ################################################# ################################################# ################################################# #################################################    Current Meds  Medication Sig  . amLODipine (NORVASC) 10 MG tablet Take 1 tablet (10 mg total) by mouth daily.  . hydrochlorothiazide (HYDRODIURIL) 25 MG tablet Take 1 tablet (25 mg total) by mouth daily.  . [DISCONTINUED] amLODipine (NORVASC) 10 MG tablet TAKE 1 TABLET(10 MG) BY MOUTH DAILY  . [DISCONTINUED] dicyclomine (BENTYL) 20 MG tablet Take 1 tablet (20  mg total) by mouth every 6 (six) hours. As needed for abdominal spasm  . [DISCONTINUED] hydrochlorothiazide (HYDRODIURIL) 25 MG tablet TAKE 1 TABLET(25 MG) BY MOUTH DAILY  . [DISCONTINUED] meloxicam (MOBIC) 15 MG tablet Take 1 tablet (15 mg total) by mouth daily. As needed for aches/pain  . [DISCONTINUED] olmesartan (BENICAR) 20 MG tablet Take 1 tablet (20 mg total) daily by mouth.    Allergies  Allergen Reactions  . Latex Other (See Comments)    Unknown  . Lisinopril Cough       Review of Systems:  Constitutional: No recent illness  Cardiac: No  chest pain, No  pressure, No palpitations  Respiratory:  No  shortness of breath. No  Cough  Musculoskeletal: No  new myalgia/arthralgia  Skin: No  Rash  Neurologic: No  weakness, No  Dizziness  Psychiatric: No  concerns with depression, No  concerns with anxiety  Exam:  BP (!) 152/56 (BP Location: Left Arm, Patient Position: Sitting, Cuff Size: Normal)   Pulse 82   Temp 98.8 F (37.1 C) (Oral)   Wt 148 lb 1.6 oz (67.2 kg)   BMI 27.09 kg/m   Constitutional: VS see above. General Appearance: alert, well-developed, well-nourished, NAD  Eyes: Normal lids and conjunctive, non-icteric sclera  Neck: No masses, trachea midline.   Respiratory: Normal respiratory effort. no wheeze, no rhonchi, no rales  Cardiovascular: S1/S2 normal, no murmur, no rub/gallop auscultated. RRR.   Musculoskeletal: Gait normal. Symmetric and independent movement of all extremities  Neurological: Normal balance/coordination. No tremor.  Skin: warm, dry, intact.   Psychiatric: Normal judgment/insight. Normal mood and affect. Oriented x3.       Visit summary with medication list and pertinent instructions was printed for patient to review, patient was advised to alert [place] if any updates are needed. All questions at time of visit were answered - patient instructed to contact office with any additional concerns. ER/RTC precautions were reviewed with the patient and understanding verbalized.   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 A diagnosis of Essential hypertension was also pertinent to this visit.Marland Kitchen  Please note: voice recognition software was used to produce this document, and typos may escape review. Please contact Dr. Sheppard Coil for any needed clarifications.    Follow up plan: Return in about 6 months (around 02/27/2019) for Medicare wellness visit with Maudie Mercury, annual physical/labs with Dr. Sheppard Coil 1 to 2 weeks after that.

## 2018-08-27 NOTE — Patient Instructions (Addendum)
Plan:  See attached for handicap form  We will try gabapentin, can take this for aches and pains and may also help with sleep.  Can try starting with 1 capsule and increase to 3 if 1 or 2 is not helping.  Refill sent for blood pressure medication  We will plan to follow-up in 6 months for Medicare wellness visit, routine blood work and annual physical.  Please let me know sooner than that if there is anything else you need!

## 2018-11-26 ENCOUNTER — Telehealth: Payer: Self-pay

## 2018-11-26 NOTE — Telephone Encounter (Signed)
Pt left a vm msg requesting an appt with provider. Pt mentioned she has been having back pains for a couple of weeks. Pain worse on the left side. Pls contact pt for scheduling. Thanks.

## 2018-11-27 NOTE — Telephone Encounter (Signed)
Noted. Thanks for the update.  

## 2018-12-06 ENCOUNTER — Other Ambulatory Visit: Payer: Self-pay

## 2018-12-06 ENCOUNTER — Ambulatory Visit (INDEPENDENT_AMBULATORY_CARE_PROVIDER_SITE_OTHER): Payer: Medicare Other | Admitting: Osteopathic Medicine

## 2018-12-06 ENCOUNTER — Encounter: Payer: Self-pay | Admitting: Osteopathic Medicine

## 2018-12-06 ENCOUNTER — Ambulatory Visit (INDEPENDENT_AMBULATORY_CARE_PROVIDER_SITE_OTHER): Payer: Medicare Other

## 2018-12-06 VITALS — BP 142/76 | HR 80 | Temp 98.2°F | Wt 146.1 lb

## 2018-12-06 DIAGNOSIS — M546 Pain in thoracic spine: Secondary | ICD-10-CM | POA: Diagnosis not present

## 2018-12-06 DIAGNOSIS — S39012A Strain of muscle, fascia and tendon of lower back, initial encounter: Secondary | ICD-10-CM

## 2018-12-06 DIAGNOSIS — M549 Dorsalgia, unspecified: Secondary | ICD-10-CM

## 2018-12-06 DIAGNOSIS — M545 Low back pain: Secondary | ICD-10-CM | POA: Diagnosis not present

## 2018-12-06 IMAGING — DX DG THORACIC SPINE 3V
3 series · 3 of 3 positions shown · non-contrast
Comparison: None.

CLINICAL DATA: Mid, low back pain

EXAM:
THORACIC SPINE - 3 VIEWS

[t-spine ap]
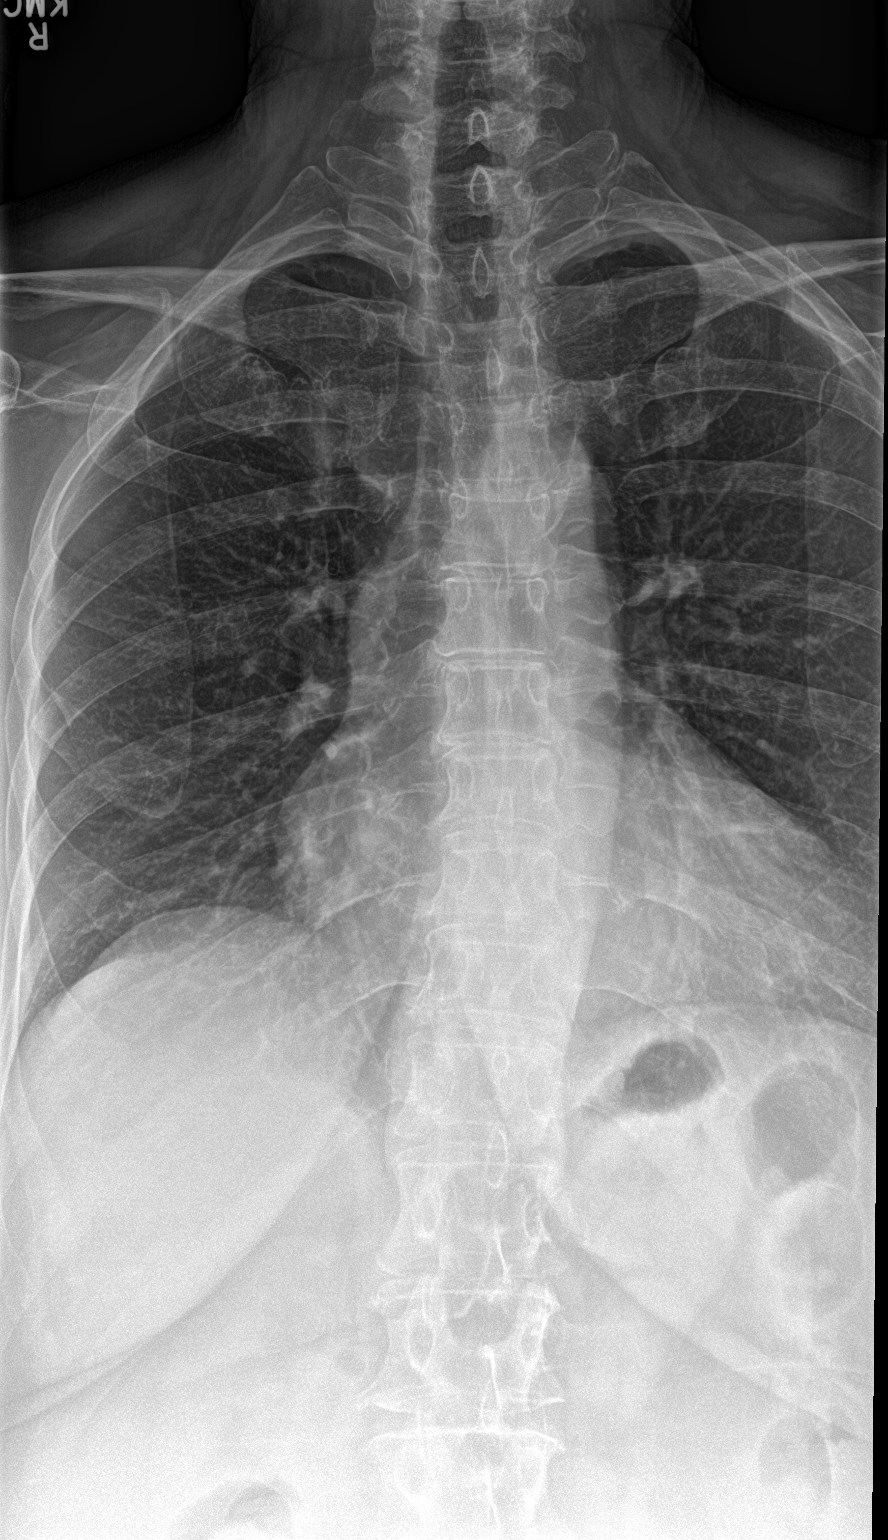

[t-spine lat]
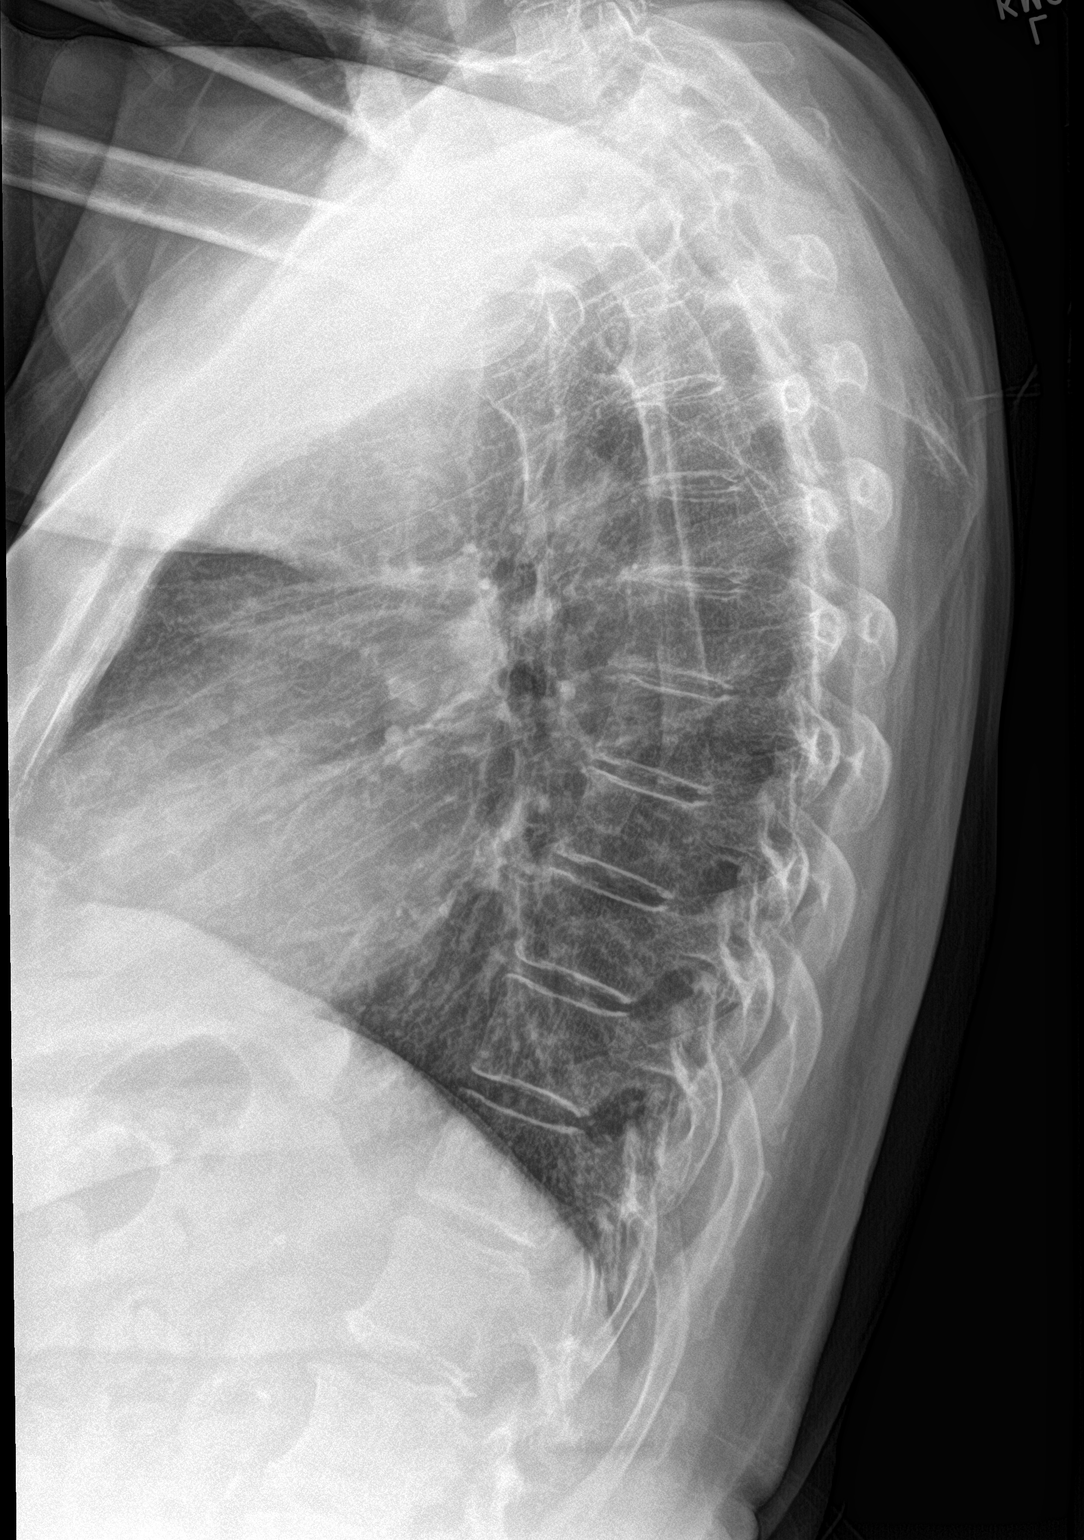

[t-spine swimmers]
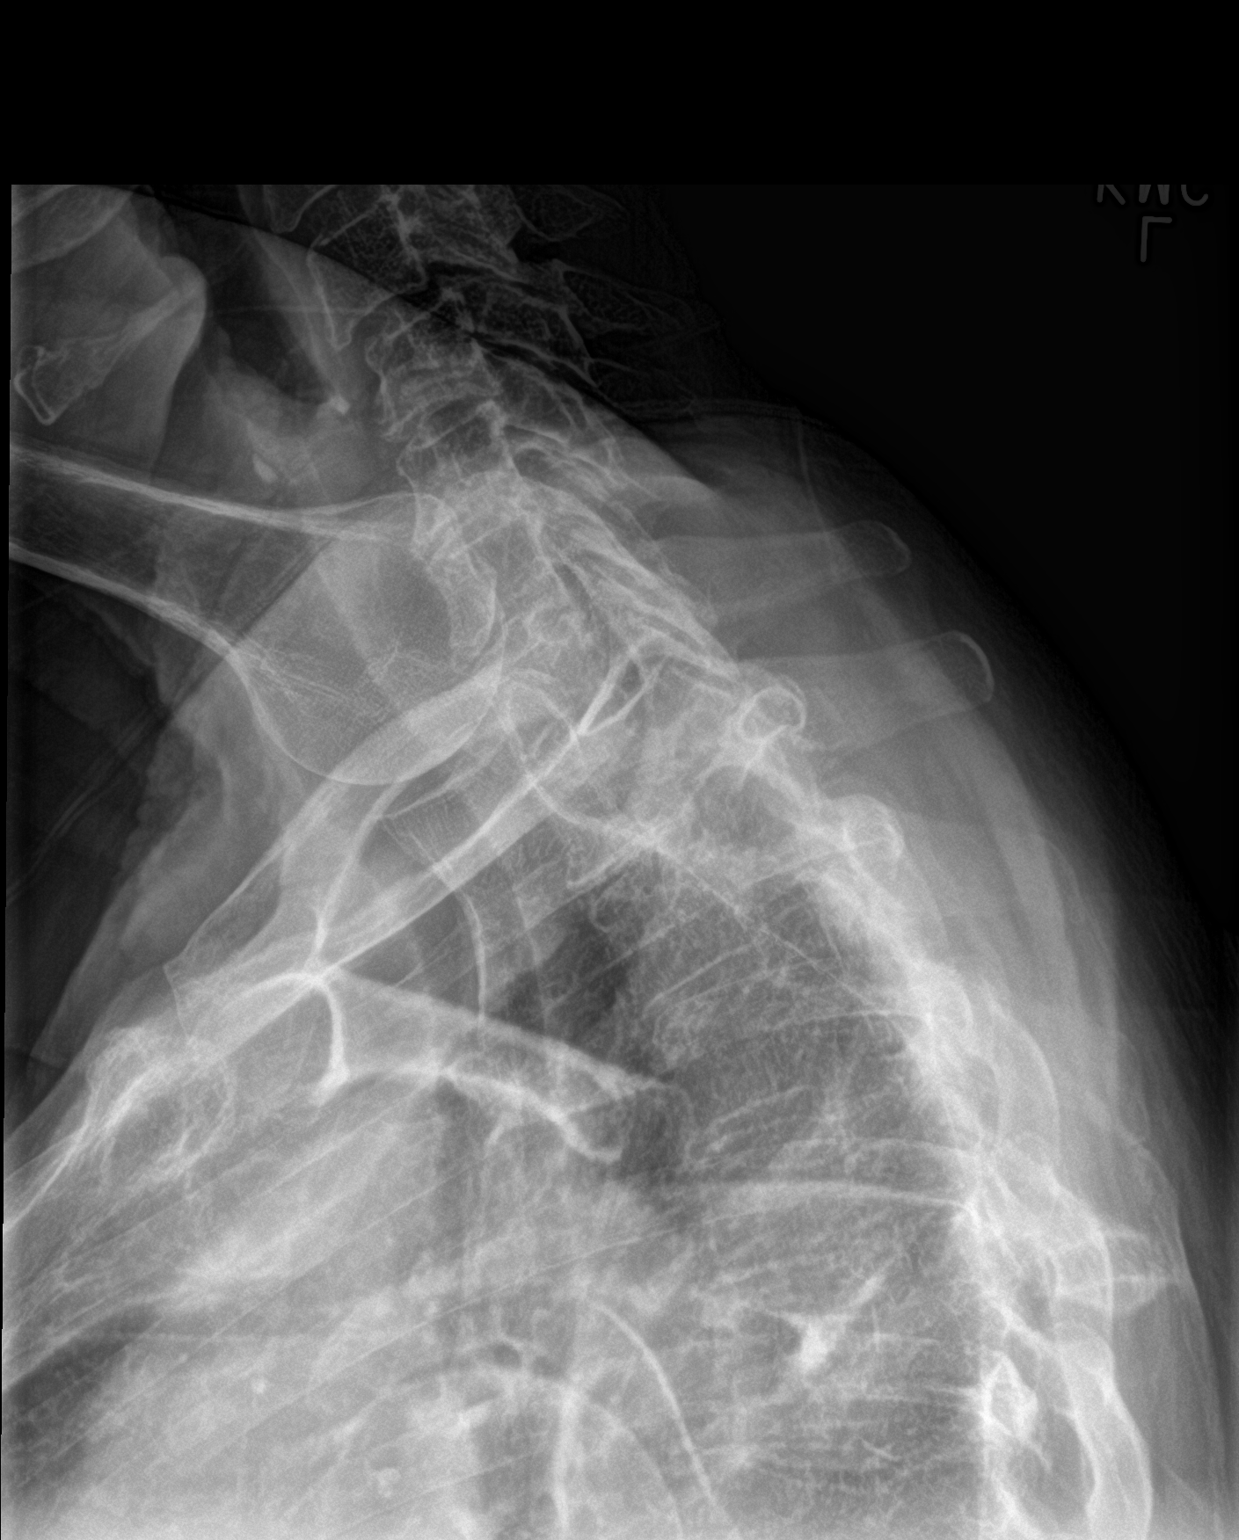

[3 of 3 positions shown; findings below may reference images not displayed]

FINDINGS: Thoracic vertebral body heights and alignment are maintained. No
significant intervertebral disc height loss. No apparent aggressive
osseous lesion.
IMPRESSION: No significant osseous abnormality.

## 2018-12-06 IMAGING — DX DG LUMBAR SPINE COMPLETE 4+V
5 series · 5 of 5 positions shown · non-contrast
Comparison: None.

CLINICAL DATA: Mid, low back pain

EXAM:
LUMBAR SPINE - COMPLETE 4+ VIEW

[l-spine ap]
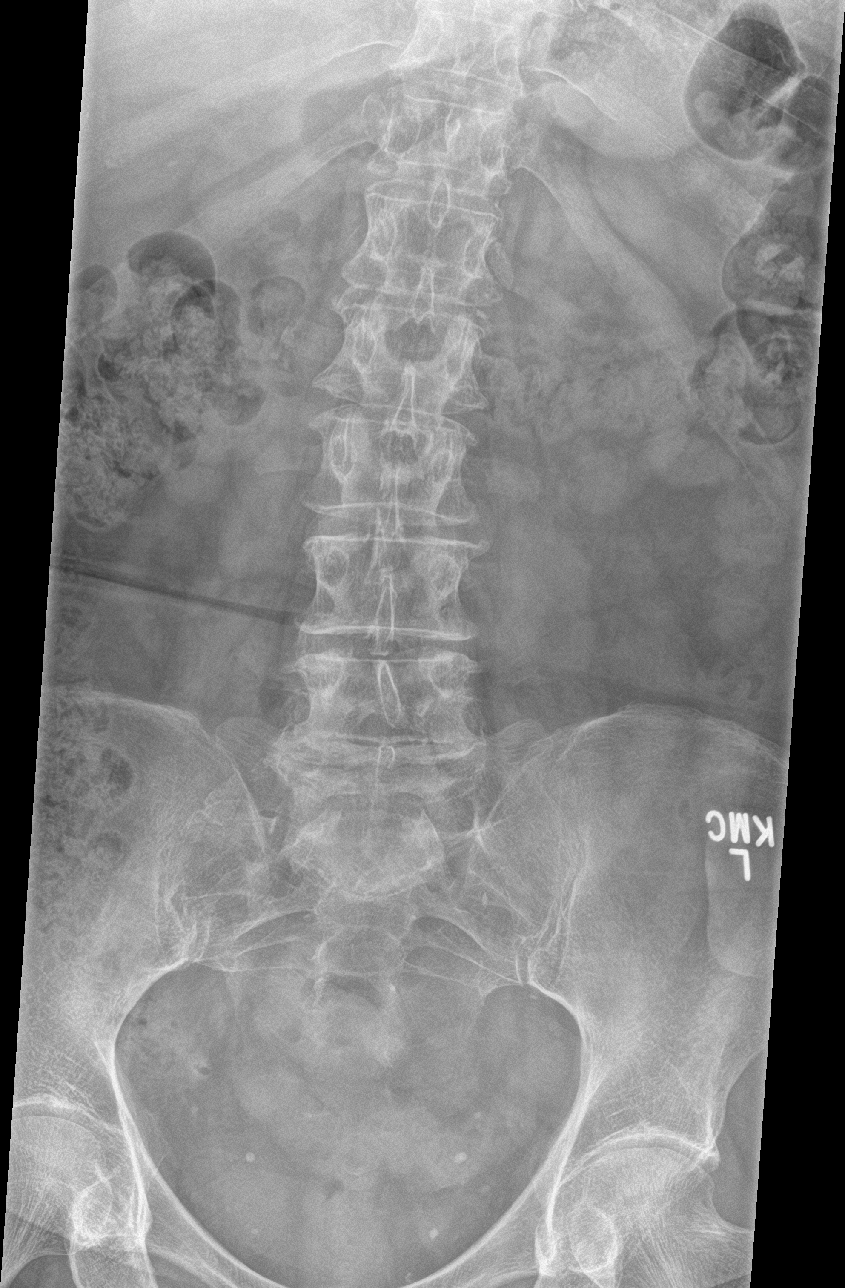

[l-spine obl (1 of 2)]
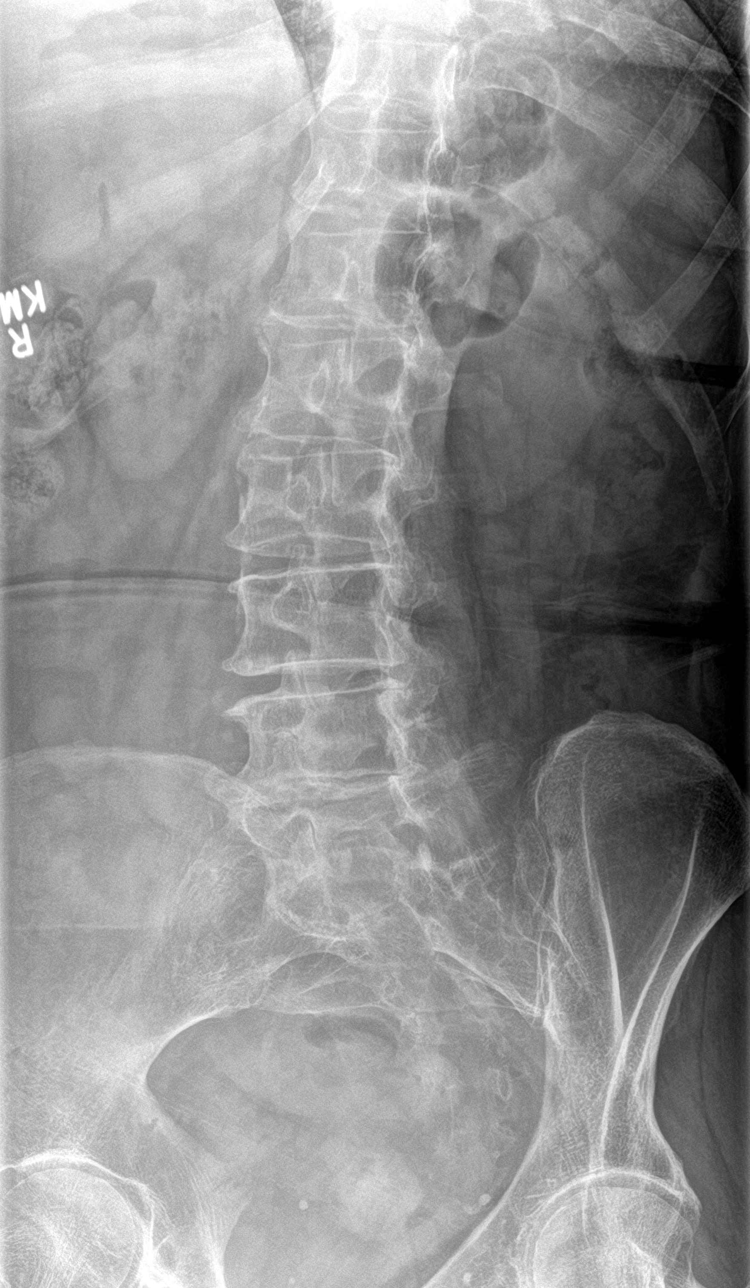

[l-spine obl (2 of 2)]
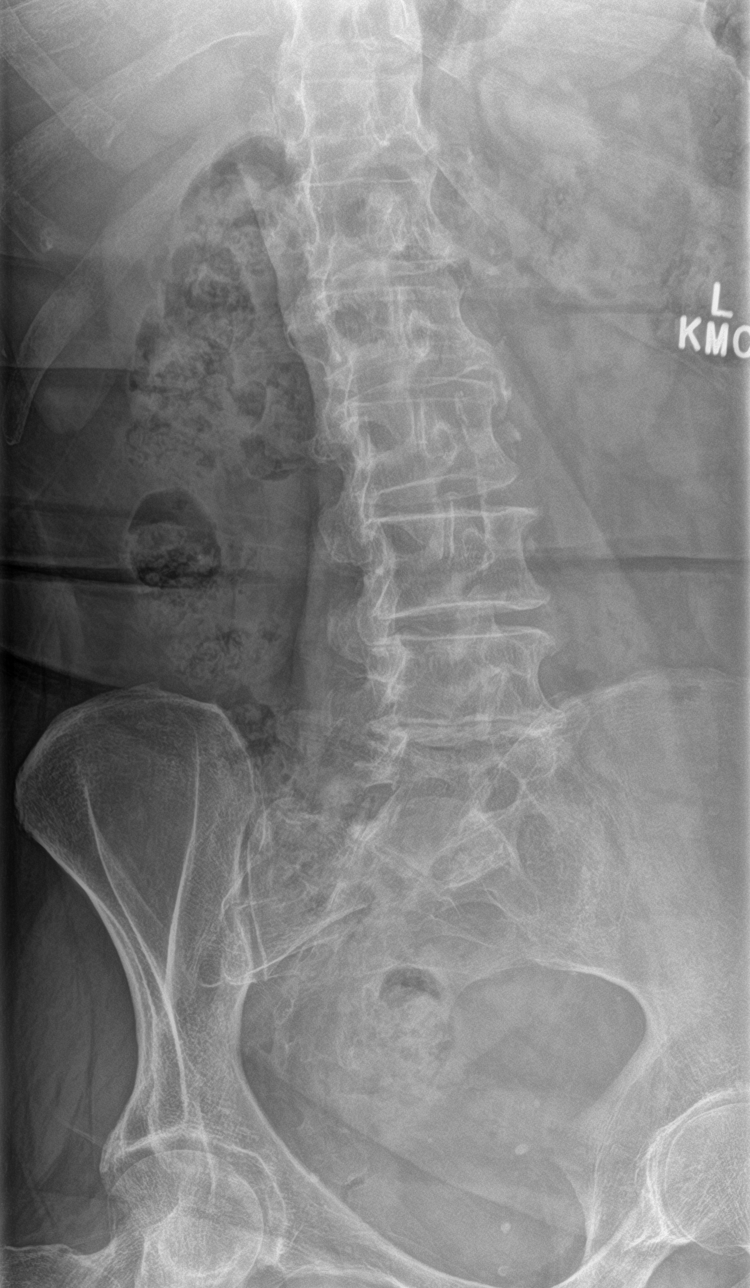

[l-spine lat]
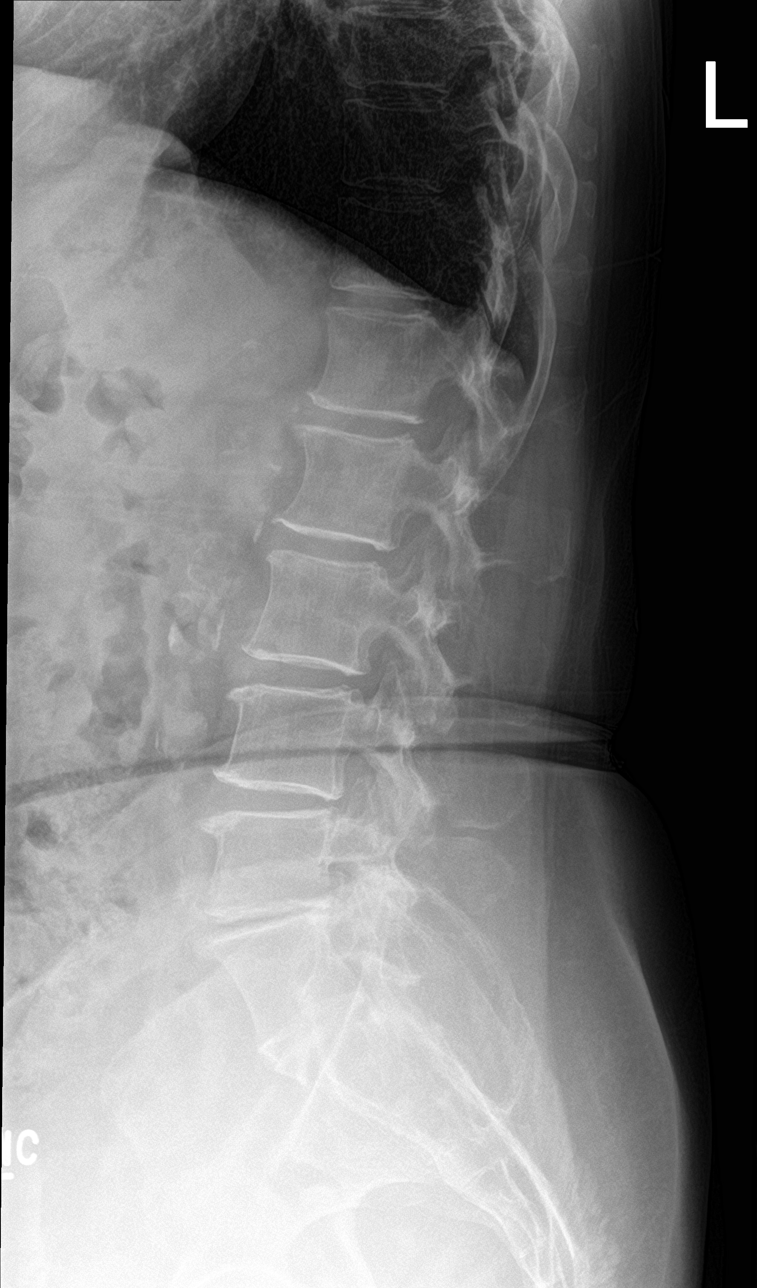

[l-spine spot]
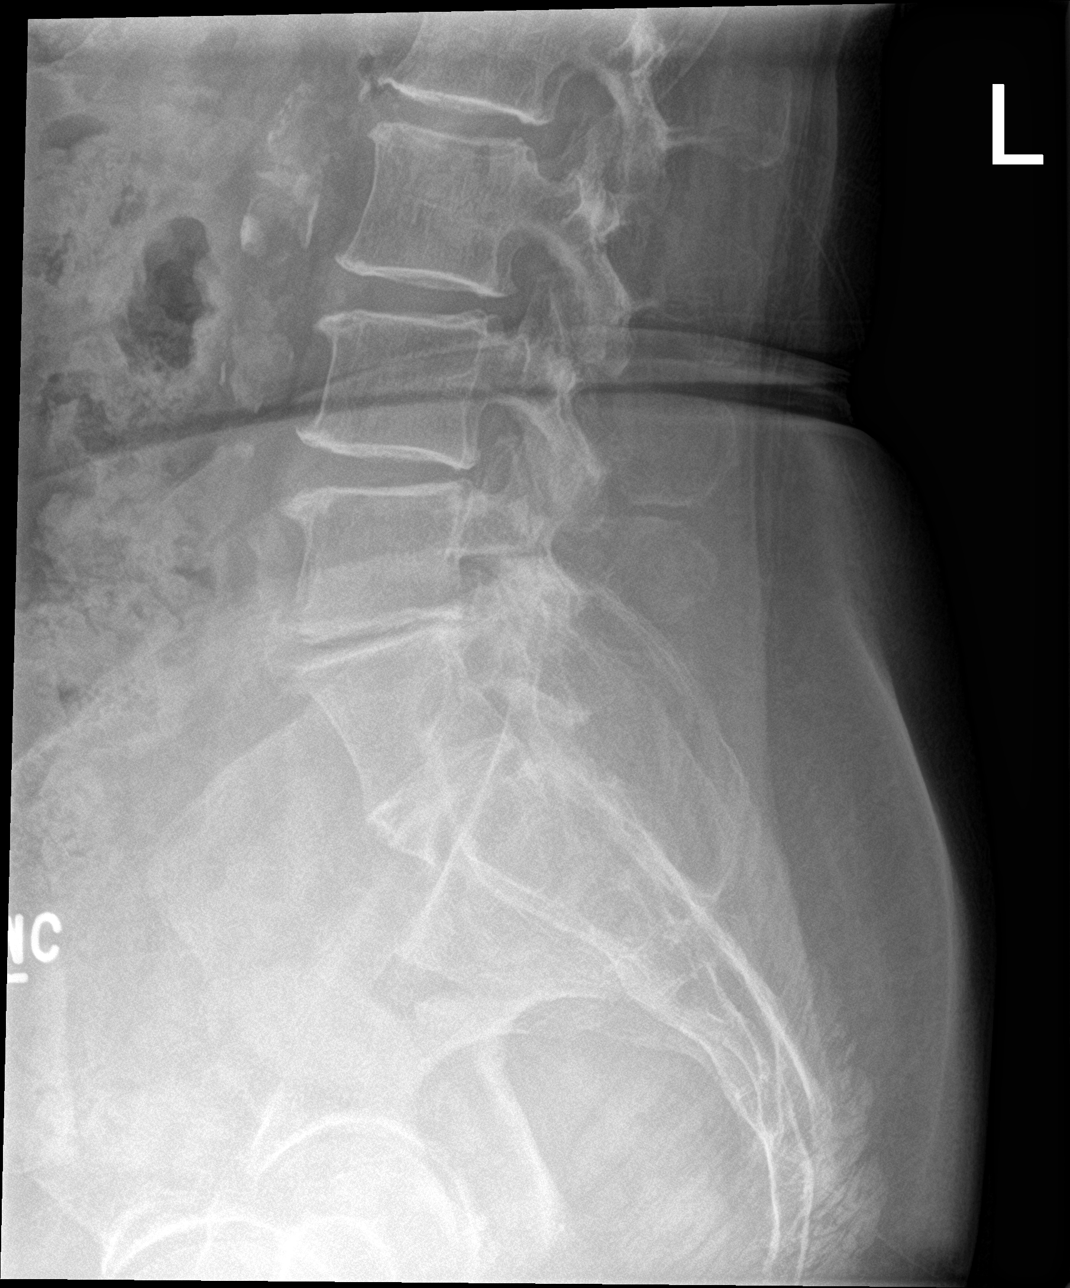

[5 of 5 positions shown; findings below may reference images not displayed]

FINDINGS: Transitional anatomy at the lumbosacral junction with partial
lumbarization of S1. Slight dextrocurvature of the lumbar spine.
Antero posterior alignment is maintained. Vertebral body heights are
preserved. There is multilevel disc space narrowing, greatest at
L5-S1. Small endplate osteophytes are present. Facet hypertrophy is
present, greatest at lower lumbar levels.
IMPRESSION: Multilevel degenerative changes.

## 2018-12-06 MED ORDER — CYCLOBENZAPRINE HCL 5 MG PO TABS: 5.0000 mg | ORAL_TABLET | Freq: Three times a day (TID) | ORAL | 0 refills | Status: DC | PRN

## 2018-12-06 MED ORDER — PREDNISONE 20 MG PO TABS: 20.0000 mg | ORAL_TABLET | Freq: Two times a day (BID) | ORAL | 0 refills | Status: DC

## 2018-12-06 MED ORDER — MELOXICAM 15 MG PO TABS: 15.0000 mg | ORAL_TABLET | Freq: Every day | ORAL | 0 refills | Status: DC

## 2018-12-06 MED ORDER — GABAPENTIN 100 MG PO CAPS: 100.0000 mg | ORAL_CAPSULE | Freq: Every day | ORAL | 0 refills | Status: DC

## 2018-12-06 NOTE — Patient Instructions (Signed)
Back pain:  I sent Rx for anti-inflammatory (meloxicam aka Mobic) to take daily for 1-2 weeks, then as-needed after that  I sent Rx for steroids to reduce inflammation and help with pain, take daily for 5 days  I sent Rx for muscle relaxer (cyclobenzaprine aka Flexeril) to take as needed for spasm, ok to take at night, might cause drowsiness, please DO NOT take with Gabapentin!  (I refilled Gabapentin too!)  See printed instructions for back pain   Xray today  If no better / worse, please let me know!

## 2018-12-06 NOTE — Progress Notes (Signed)
HPI: Jill Bernard is a 73 y.o. female who  has no past medical history on file.  she presents to [facility] today, 12/06/18,  for chief complaint of:  Back pain   . Context: helping family clean house and move, doesn't recall specific injury such as fall or lifting/popping  . Location: L flank/lower back going into midline occasionally.  . Quality: sore, sharp . Duration: 2.5 to 3 weeks . Modifying factors: advil and ibuprofen give minimal relief. Worse w/ going sitting to standing. . Requests refill on gabapentin     At today's visit 12/06/18 ... PMH, PSH, FH reviewed and updated as needed.  Current medication list and allergy/intolerance hx reviewed and updated as needed. (See remainder of HPI, ROS, Phys Exam below)   No results found.  No results found for this or any previous visit (from the past 72 hour(s)).        ASSESSMENT/PLAN: The primary encounter diagnosis was Mid back pain on left side. A diagnosis of Lumbar strain, initial encounter was also pertinent to this visit.   Orders Placed This Encounter  Procedures  . DG Thoracic Spine W/Swimmers  . DG Lumbar Spine Complete     Meds ordered this encounter  Medications  . meloxicam (MOBIC) 15 MG tablet    Sig: Take 1 tablet (15 mg total) by mouth daily.    Dispense:  30 tablet    Refill:  0  . predniSONE (DELTASONE) 20 MG tablet    Sig: Take 1 tablet (20 mg total) by mouth 2 (two) times daily with a meal. (breakfst and lunch)    Dispense:  10 tablet    Refill:  0  . cyclobenzaprine (FLEXERIL) 5 MG tablet    Sig: Take 1-2 tablets (5-10 mg total) by mouth 3 (three) times daily as needed for muscle spasms.    Dispense:  30 tablet    Refill:  0  . gabapentin (NEURONTIN) 100 MG capsule    Sig: Take 1-3 capsules (100-300 mg total) by mouth at bedtime.    Dispense:  90 capsule    Refill:  0    Patient Instructions  Back pain:  I sent Rx for anti-inflammatory  (meloxicam aka Mobic) to take daily for 1-2 weeks, then as-needed after that  I sent Rx for steroids to reduce inflammation and help with pain, take daily for 5 days  I sent Rx for muscle relaxer (cyclobenzaprine aka Flexeril) to take as needed for spasm, ok to take at night, might cause drowsiness, please DO NOT take with Gabapentin!  (I refilled Gabapentin too!)  See printed instructions for back pain   Xray today  If no better / worse, please let me know!          Follow-up plan: Return if symptoms worsen or fail to improve.                                                 ################################################# ################################################# ################################################# #################################################    Current Meds  Medication Sig  . amLODipine (NORVASC) 10 MG tablet Take 1 tablet (10 mg total) by mouth daily.  Marland Kitchen gabapentin (NEURONTIN) 100 MG capsule Take 1-3 capsules (100-300 mg total) by mouth at bedtime.  . hydrochlorothiazide (HYDRODIURIL) 25 MG tablet Take 1 tablet (25 mg total) by mouth daily.  . [DISCONTINUED] gabapentin (  NEURONTIN) 100 MG capsule Take 1-3 capsules (100-300 mg total) by mouth at bedtime.    Allergies  Allergen Reactions  . Latex Other (See Comments)    Unknown  . Lisinopril Cough       Review of Systems:  Constitutional: No recent illness  HEENT: No  headache, no vision change  Cardiac: No  chest pain, No  pressure, No palpitations  Respiratory:  No  shortness of breath. No  Cough  Gastrointestinal: No  abdominal pain, no change on bowel habits  Musculoskeletal: +new myalgia/arthralgia per HPI  Skin: No  Rash  Neurologic: No  weakness, No  Dizziness  Psychiatric: No  concerns with depression, No  concerns with anxiety  Exam:  BP (!) 142/76 (BP Location: Left Arm, Patient Position: Sitting, Cuff Size: Normal)    Pulse 80   Temp 98.2 F (36.8 C) (Oral)   Wt 146 lb 1.3 oz (66.3 kg)   BMI 26.72 kg/m   BP Readings from Last 3 Encounters:  12/06/18 (!) 142/76  08/27/18 (!) 152/56  05/21/18 (!) 158/68    Constitutional: VS see above. General Appearance: alert, well-developed, well-nourished, NAD  Eyes: Normal lids and conjunctive, non-icteric sclera  Neck: No masses, trachea midline.   Respiratory: Normal respiratory effort. no wheeze, no rhonchi, no rales  Cardiovascular: S1/S2 normal, no murmur, no rub/gallop auscultated. RRR.   Musculoskeletal: Gait normal. Symmetric and independent movement of all extremities. (+)tenderness to palpation L mid paraspinals worse e/ flexion forward   Abdominal: non-tender, non-distended  Neurological: Normal balance/coordination. No tremor.  Skin: warm, dry, intact.   Psychiatric: Normal judgment/insight. Normal mood and affect. Oriented x3.       Visit summary with medication list and pertinent instructions was printed for patient to review, patient was advised to alert [place] if any updates are needed. All questions at time of visit were answered - patient instructed to contact office with any additional concerns. ER/RTC precautions were reviewed with the patient and understanding verbalized.     Please note: voice recognition software was used to produce this document, and typos may escape review. Please contact Dr. Sheppard Coil for any needed clarifications.    Follow up plan: Return if symptoms worsen or fail to improve.

## 2018-12-17 DIAGNOSIS — Z719 Counseling, unspecified: Secondary | ICD-10-CM | POA: Diagnosis not present

## 2019-01-14 ENCOUNTER — Encounter: Payer: Self-pay | Admitting: Nurse Practitioner

## 2019-01-14 ENCOUNTER — Other Ambulatory Visit: Payer: Self-pay

## 2019-01-14 ENCOUNTER — Ambulatory Visit (INDEPENDENT_AMBULATORY_CARE_PROVIDER_SITE_OTHER): Payer: Medicare Other | Admitting: Nurse Practitioner

## 2019-01-14 VITALS — BP 141/75 | HR 88 | Temp 98.5°F | Ht 62.0 in | Wt 140.1 lb

## 2019-01-14 DIAGNOSIS — R55 Syncope and collapse: Secondary | ICD-10-CM | POA: Diagnosis not present

## 2019-01-14 DIAGNOSIS — R05 Cough: Secondary | ICD-10-CM

## 2019-01-14 DIAGNOSIS — R058 Other specified cough: Secondary | ICD-10-CM

## 2019-01-14 NOTE — Progress Notes (Addendum)
Acute Office Visit  Subjective:    Patient ID: Jill Bernard, female    DOB: June 17, 1945, 74 y.o.   MRN: [id number]  Chief Complaint  Patient presents with  . Weakness    HPI Jill Bernard is a pleasant 73 y/o female presenting today for sudden onset of lightheadedness, dizziness, and "feeling like I was going to pass out" yesterday while at work. 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 She had been at work about 1 hour. She had not had breakfast yet.  EMS was called and performed an EKG and blood glucose check. No blood sugar results available. Image of the EKG in the chart. Per the patient, EMS told her she was dehydrated. It was not recommended that she go to the hospital to treatment. She reports this has happened in the past. She reports mild fatigue and chills after the episode.   She reports she drinks 2 bottles of water and 2-3 Pepsi's per day on average.  COUGH Pt also reports a mild, non-productive cough at night when in bed. This started about a month ago, about the same time she started work. She denies allergy symptoms in the past.      History reviewed. No pertinent past medical history.  History reviewed. No pertinent surgical history.  Family History  Problem Relation Age of Onset  . Hypertension Father   . Diabetes Maternal Grandmother   . Diabetes Maternal Grandfather     Social History   Socioeconomic History  . Marital status: Single    Spouse name: Not on file  . Number of children: Not on file  . Years of education: Not on file  . Highest education level: Not on file  Occupational History  . Not on file  Tobacco Use  . Smoking status: Never Smoker  . Smokeless tobacco: Never Used  Substance and Sexual Activity  . Alcohol use: Not on file  . Drug use: Not on file  . Sexual activity: Not on file  Other Topics Concern  . Not on file  Social History Narrative  . Not on file   Social Determinants of Health    Financial Resource Strain:   . Difficulty of Paying Living Expenses: Not on file  Food Insecurity:   . Worried About Charity fundraiser in the Last Year: Not on file  . Ran Out of Food in the Last Year: Not on file  Transportation Needs:   . Lack of Transportation (Medical): Not on file  . Lack of Transportation (Non-Medical): Not on file  Physical Activity:   . Days of Exercise per Week: Not on file  . Minutes of Exercise per Session: Not on file  Stress:   . Feeling of Stress : Not on file  Social Connections:   . Frequency of Communication with Friends and Family: Not on file  . Frequency of Social Gatherings with Friends and Family: Not on file  . Attends Religious Services: Not on file  . Active Member of Clubs or Organizations: Not on file  . Attends Archivist Meetings: Not on file  . Marital Status: Not on file  Intimate Partner Violence:   . Fear of Current or Ex-Partner: Not on file  . Emotionally Abused: Not on file  . Physically Abused: Not on file  . Sexually Abused: Not on file    Outpatient Medications Prior to Visit  Medication Sig Dispense Refill  . amLODipine (NORVASC) 10 MG tablet Take 1  tablet (10 mg total) by mouth daily. 90 tablet 1  . cyclobenzaprine (FLEXERIL) 5 MG tablet Take 1-2 tablets (5-10 mg total) by mouth 3 (three) times daily as needed for muscle spasms. 30 tablet 0  . gabapentin (NEURONTIN) 100 MG capsule Take 1-3 capsules (100-300 mg total) by mouth at bedtime. 90 capsule 0  . hydrochlorothiazide (HYDRODIURIL) 25 MG tablet Take 1 tablet (25 mg total) by mouth daily. 90 tablet 1  . meloxicam (MOBIC) 15 MG tablet Take 1 tablet (15 mg total) by mouth daily. 30 tablet 0  . predniSONE (DELTASONE) 20 MG tablet Take 1 tablet (20 mg total) by mouth 2 (two) times daily with a meal. (breakfst and lunch) 10 tablet 0   No facility-administered medications prior to visit.    Allergies  Allergen Reactions  . Latex Other (See Comments)     Unknown  . Lisinopril Cough    Review of Systems No total loss of consciousness No numbness/tingling/weakness on one or both sides of the body No loss of bowel or bladder control No chest pain/palpitations/shortness of breath No anxiousness No headache No nausea/vomiting/diarrhea  No fever/chills No post nasal drip/rhinorhea No headache No sinus pain/pressure No shortness of breath/wheezing No ear pain/pressure No sore throat     Objective:    Physical Exam Vitals and nursing note reviewed.  Constitutional:      Appearance: Normal appearance.  HENT:     Head: Normocephalic.     Nose: Nose normal.     Mouth/Throat:     Mouth: Mucous membranes are moist.     Pharynx: Oropharynx is clear. No posterior oropharyngeal erythema.  Eyes:     Pupils: Pupils are equal, round, and reactive to light.  Cardiovascular:     Rate and Rhythm: Normal rate and regular rhythm.     Pulses: Normal pulses.     Heart sounds: Normal heart sounds.     Comments: Orthostatic Vital Signs:  Laying 121/76 - pulse 91 Sitting 131/82 - pulse 91 Standing 134/82 - pulse 90 Pulmonary:     Effort: Pulmonary effort is normal. No respiratory distress.     Breath sounds: Normal breath sounds. No wheezing or rhonchi.  Abdominal:     General: Abdomen is flat.     Palpations: Abdomen is soft.  Musculoskeletal:        General: No swelling.     Right lower leg: No edema.     Left lower leg: No edema.  Skin:    General: Skin is warm and dry.     Capillary Refill: Capillary refill takes less than 2 seconds.  Neurological:     General: No focal deficit present.     Mental Status: She is alert and oriented to person, place, and time.     Motor: No weakness.     Coordination: Coordination normal.     Gait: Gait normal.  Psychiatric:        Mood and Affect: Mood normal.        Behavior: Behavior normal.     BP (!) 141/75   Pulse 88   Temp 98.5 F (36.9 C) (Oral)   Ht 5\' 2"  (1.575 m)   Wt 140 lb  1.3 oz (63.5 kg)   SpO2 96%   BMI 25.62 kg/m  Wt Readings from Last 3 Encounters:  01/14/19 140 lb 1.3 oz (63.5 kg)  12/06/18 146 lb 1.3 oz (66.3 kg)  08/27/18 148 lb 1.6 oz (67.2 kg)    Health Maintenance  Due  Topic Date Due  . Hepatitis C Screening  [birth year]  . MAMMOGRAM  04/25/1997  . COLONOSCOPY  04/25/1997  . DEXA SCAN  04/25/2012  . PNA vac Low Risk Adult (2 of 2 - PCV13) 09/30/2013    There are no preventive care reminders to display for this patient.   Lab Results  Component Value Date   TSH 1.19 03/13/2018   Lab Results  Component Value Date   WBC 6.9 03/13/2018   HGB 12.6 03/13/2018   HCT 36.7 03/13/2018   MCV 92.0 03/13/2018   PLT 248 03/13/2018   Lab Results  Component Value Date   NA 140 03/13/2018   K 3.8 03/13/2018   CO2 29 03/13/2018   GLUCOSE 111 (H) 03/13/2018   BUN 12 03/13/2018   CREATININE 0.71 03/13/2018   BILITOT 0.7 03/13/2018   AST 13 03/13/2018   ALT 16 03/13/2018   PROT 7.2 03/13/2018   CALCIUM 9.4 03/13/2018   Lab Results  Component Value Date   CHOL 263 (H) 03/13/2018   Lab Results  Component Value Date   HDL 50 03/13/2018   Lab Results  Component Value Date   LDLCALC 176 (H) 03/13/2018   Lab Results  Component Value Date   TRIG 209 (H) 03/13/2018   Lab Results  Component Value Date   CHOLHDL 5.3 (H) 03/13/2018   No results found for: HGBA1C     Assessment & Plan:   1. Near syncope Orthostatic vitals good today. No episodes since yesterday. EKG from yesterday reviewed. Cardiac work-up from June 2020 reviewed. Dehydration a likely cause- instructed patient to increase water consumption and decrease caffeine intake. Consider dysautonomic etiology if symptoms persist despite hydration. Appointment in March for physical exam. Pt instructed to follow-up sooner if symptoms return.   2. Allergic cough Suspect this is due to working in a dusty, indoor environment working with boxes. Instructed the patient to try OTC  allergy medications (Zyrtec/Claritin) to see if her symptoms improved. Appointment in March for physical exam and blood work.   Orma Render, NP

## 2019-01-14 NOTE — Patient Instructions (Addendum)
You need to drink plenty of water every day. If you drink soda, I recommended drink decaffeinated sodas.  I believe your cough may be from allergies- possibly from working in a dusty environment. You can take over the counter Zyrtec or Claritin to see if this helps.   Near-Syncope Near-syncope is when you suddenly get weak or dizzy, or you feel like you might pass out (faint). This may also be called presyncope. This is due to a lack of blood flow to the brain. During an episode of near-syncope, you may:  Feel dizzy, weak, or light-headed.  Feel sick to your stomach (nauseous).  See all white or all black.  See spots.  Have cold, clammy skin. This condition is caused by a sudden decrease in blood flow to the brain. This decrease can result from various causes, but most of those causes are not dangerous. However, near-syncope may be a sign of a serious medical problem, so it is important to seek medical care. Follow these instructions at home: Medicines  Take over-the-counter and prescription medicines only as told by your doctor.  If you are taking blood pressure or heart medicine, get up slowly and spend many minutes getting ready to sit and then stand. This can help with dizziness. General instructions  Be aware of any changes in your symptoms.  Talk with your doctor about your symptoms. You may need to have testing to find the cause of your near-syncope.  If you start to feel like you might pass out, lie down right away. Raise (elevate) your feet above the level of your heart. Breathe deeply and steadily. Wait until all of the symptoms are gone.  Have someone stay with you until you feel stable.  Do not drive, use machinery, or play sports until your doctor says it is okay.  Drink enough fluid to keep your pee (urine) pale yellow.  Keep all follow-up visits as told by your doctor. This is important. Get help right away if you:  Have a seizure.  Have pain in  your: ? Chest. ? Belly (abdomen). ? Back.  Faint once or more than once.  Have a very bad headache.  Are bleeding from your mouth or butt.  Have black or tarry poop (stool).  Have a very fast or uneven heartbeat (palpitations).  Are mixed up (confused).  Have trouble walking.  Are very weak.  Have trouble seeing. These symptoms may be an emergency. Do not wait to see if the symptoms will go away. Get medical help right away. Call your local emergency services (911 in the [place] Do not drive yourself to the hospital. Summary  Near-syncope is when you suddenly get weak or dizzy, or you feel like you might pass out (faint).  This condition is caused by a lack of blood flow to the brain.  Near-syncope may be a sign of a serious medical problem, so it is important to seek medical care. This information is not intended to replace advice given to you by your health care provider. Make sure you discuss any questions you have with your health care provider. Document Revised: 04/12/2018 Document Reviewed: 11/07/2017 Elsevier Patient Education  [place].   Dehydration Dehydration is condition in which there is not enough water or other fluids in the body. This happens when a person loses more fluids than he or she takes in. Important body parts cannot work right without the right amount of fluids. Any loss of fluids from the body can cause  dehydration. People 35 years of age or older have a higher risk of dehydration than younger adults. This is because in older age, the body:  Is less able to keep the right amount of water.  Does not respond to temperature changes as well.  Does not get a sense of thirst as easily or quickly. Dehydration can be mild, worse, or very bad. It should be treated right away to keep it from getting very bad. What are the causes? This condition may be caused by:  Conditions that cause loss of water or other fluids, such as: ? Watery poop  (diarrhea). ? Vomiting. ? Sweating a lot. ? Peeing (urinating) a lot.  Not drinking enough fluids, especially when you: ? Are ill. ? Are doing things that take a lot of energy to do.  Other illnesses and conditions, such as fever or infection.  Certain medicines, such as medicines that take extra fluid out of the body (diuretics).  Lack of safe drinking water.  Not being able to get enough water and food. What increases the risk? The following factors may make you more likely to develop this condition:  Having a long-term (chronic) illness that has not been treated the right way, such as: ? An illness that may cause you to pee more, such as diabetes. ? Kidney, heart, or lung disease. ? A condition such as dementia. This affects:  The brain and nervous system.  Thinking.  Feelings.  Being 43 years of age or older.  Having a disability.  Living in a place that is high above the ground or sea (high in altitude). The thinner, drier air causes more fluid loss. What are the signs or symptoms? Symptoms of dehydration depend on how bad it is. Mild or worse dehydration  Thirst.  Dry lips or dry mouth.  Feeling dizzy or light-headed, especially when you stand up from sitting.  Muscle cramps.  Your body making: ? Dark pee (urine). Pee may be the color of tea. ? Less pee than normal. ? Less tears than normal.  Headache. Very bad dehydration  Changes in skin. Skin may: ? Be cold to the touch (clammy). ? Be blotchy or pale. ? Not go back to normal right after you lightly pinch it and let it go.  Little or no tears, pee, or sweat.  Changes in vital signs, such as: ? Fast breathing. ? Low blood pressure. ? Weak pulse. ? Pulse that is more than 100 beats a minute when you are sitting still.  Other changes, such as: ? Feeling very thirsty. ? Eyes that look hollow (sunken). ? Cold hands and feet. ? Being mixed up (confused). ? Being very tired (lethargic) or  having trouble waking from sleep. ? Short-term weight loss. ? Loss of consciousness. How is this treated? Treatment for this condition depends on how bad it is. Treatment should start right away. Do not wait until your condition gets very bad. Very bad dehydration is an emergency. You will need to go to a hospital.  Mild or worse dehydration can be treated at home. You may be asked to: ? Drink more fluids. ? Drink an oral rehydration solution (ORS). This drink helps get the right amounts of fluids and salts and minerals in the blood (electrolytes).  Very bad dehydration can be treated: ? With fluids through an IV tube. ? By getting normal levels of salts and minerals in your blood. This is often done by giving salts and minerals through a tube. The  tube is passed through your nose and into your stomach. ? By treating the root cause. Follow these instructions at home: Oral rehydration solution If told by your doctor, drink an ORS:  Make an ORS. Use instructions on the package.  Start by drinking small amounts, about  cup (120 mL) every 5-10 minutes.  Slowly drink more until you have had the amount that your doctor said to have. Eating and drinking         Drink enough clear fluid to keep your pee pale yellow. If you were told to drink an ORS, finish the ORS first. Then, start slowly drinking other clear fluids. Drink fluids such as: ? Water. Do not drink only water. Doing that can make the salt (sodium) level in your body get too low. ? Water from ice chips you suck on. ? Fruit juice that you have added water to (diluted). ? Low-calorie sports drinks.  Eat foods that have the right amounts of salts and minerals, such as: ? Bananas. ? Oranges. ? Potatoes. ? Tomatoes. ? Spinach.  Do not drink alcohol.  Avoid: ? Drinks that have a lot of sugar. These include:  High-calorie sports drinks.  Fruit juice that you did not add water to.  Soda.  Caffeine. ? Foods that are  greasy or have a lot of fat or sugar. General instructions  Take over-the-counter and prescription medicines only as told by your doctor.  Do not take salt tablets. Doing that can make the salt level in your body get too high.  Return to your normal activities as told by your doctor. Ask your doctor what activities are safe for you.  Keep all follow-up visits as told by your doctor. This is important. Contact a doctor if:  You have pain in your belly (abdomen) and the pain: ? Gets worse. ? Stays in one place.  You have a rash.  You have a stiff neck.  You get angry or annoyed (irritable) more easily than normal.  You are more tired or have a harder time waking than normal.  You feel: ? Weak or dizzy. ? Very thirsty. Get help right away if you have:  Any symptoms of very bad dehydration.  A fever.  A very bad headache.  Symptoms of vomiting, such as: ? Your vomiting gets worse or does not go away. ? Your vomit has blood or green stuff in it. ? You cannot eat or drink without vomiting.  Problems with peeing or pooping (having a bowel movement), such as: ? Watery poop that gets worse or does not go away. ? Blood in your poop (stool). This may cause poop to look black and tarry. ? Not peeing in 6-8 hours. ? Peeing only a small amount of very dark pee in 6-8 hours.  Trouble breathing.  Symptoms that get worse with treatment. These symptoms may be an emergency. Do not wait to see if the symptoms will go away. Get medical help right away. Call your local emergency services (911 in the [place] Do not drive yourself to the hospital. Summary  Dehydration is a condition in which there is not enough water or other fluids in the body. This happens when a person loses more fluids than he or she takes in.  Treatment for this condition depends on how bad it is. Treatment should be started right away. Do not wait until your condition gets very bad.  Drink enough clear fluid to  keep your pee pale yellow. If you were  told to drink an oral rehydration solution (ORS), finish the ORS first. Then, start slowly drinking other clear fluids.  Take over-the-counter and prescription medicines only as told by your doctor.  Get help right away if you have any symptoms of very bad dehydration. This information is not intended to replace advice given to you by your health care provider. Make sure you discuss any questions you have with your health care provider. Document Revised: 08/01/2018 Document Reviewed: 08/01/2018 Elsevier Patient Education  [place].

## 2019-03-17 ENCOUNTER — Encounter: Payer: Medicare Other | Admitting: Osteopathic Medicine

## 2019-03-28 ENCOUNTER — Other Ambulatory Visit: Payer: Self-pay

## 2019-03-28 MED ORDER — GABAPENTIN 100 MG PO CAPS: 100.0000 mg | ORAL_CAPSULE | Freq: Every day | ORAL | 0 refills | Status: DC

## 2019-03-28 MED ORDER — MELOXICAM 15 MG PO TABS: 15.0000 mg | ORAL_TABLET | Freq: Every day | ORAL | 0 refills | Status: DC

## 2019-08-26 ENCOUNTER — Ambulatory Visit (INDEPENDENT_AMBULATORY_CARE_PROVIDER_SITE_OTHER): Payer: Medicare Other | Admitting: Nurse Practitioner

## 2019-08-26 ENCOUNTER — Encounter: Payer: Self-pay | Admitting: Nurse Practitioner

## 2019-08-26 VITALS — BP 196/89 | HR 75 | Temp 98.3°F | Ht 62.0 in | Wt 140.3 lb

## 2019-08-26 DIAGNOSIS — G4762 Sleep related leg cramps: Secondary | ICD-10-CM | POA: Diagnosis not present

## 2019-08-26 DIAGNOSIS — Z23 Encounter for immunization: Secondary | ICD-10-CM | POA: Diagnosis not present

## 2019-08-26 DIAGNOSIS — I10 Essential (primary) hypertension: Secondary | ICD-10-CM

## 2019-08-26 MED ORDER — AMLODIPINE BESYLATE 10 MG PO TABS: 10.0000 mg | ORAL_TABLET | Freq: Every day | ORAL | 0 refills | Status: DC

## 2019-08-26 MED ORDER — CHLORTHALIDONE 25 MG PO TABS: 25.0000 mg | ORAL_TABLET | Freq: Every day | ORAL | 0 refills | Status: DC

## 2019-08-26 MED ORDER — GABAPENTIN 100 MG PO CAPS
100.0000 mg | ORAL_CAPSULE | Freq: Every day | ORAL | 0 refills | Status: DC
Start: 2019-08-26 — End: 2019-09-29

## 2019-08-26 MED ORDER — AMBULATORY NON FORMULARY MEDICATION: 0 refills | Status: AC

## 2019-08-26 NOTE — Progress Notes (Signed)
Acute Office Visit  Subjective:    Patient ID: Jill Bernard, female    DOB: May 14, 1945, 74 y.o.   MRN: [id number]  Chief Complaint  Patient presents with  . Dizziness    will work for about 2 hours and will start to have some dizziness, then will have to go home around 11:00 because she does not feel like she can stand all day  . Leg Pain    left leg swelling below knee  . Insomnia    refill gabapentin    HPI Patient is in today for Dizziness and Weakness and left Leg Pain.  Dizziness and Weakness:   Onset: Sudden onset about a week ago  Quality: Feeling of weakness and dizziness- not so much the room spinning, but just a generalized feeling of weakness.   Duration: For about a week at this time, however, this occurs frequently  Severity: Very severe  Duration: Lasts while up and moving around, but gets better when she sits down and rests for a while.   Timing: No particular timing, but occurs when up moving around   Modifying factors: The only thing that makes this better is resting and being still.   She endorses positive headache, positive floaters, shortness of breath with exertion and dizziness and weakness. She denies chest pain, cough, lower extremity edema, weakness on one side, difficulty speaking, difficulty seeing.  Patient has a positive history for episodes of dizziness. Past work-up reveals no abnormalities in cardiac rhythm, no previous orthostatic changes to blood pressure, carotid dopplers from 06/2018 did show 40-59% stenosis of both the right and left carotid arteries. Chart review reveals a call in July 2020 from patient with symptoms consistent with TIA symptoms where she was advised to go to the hospital for evaluation- she reports that she did not go for evaluation at that time.  Her blood pressure is severely elevated in the office today.  History reviewed. No pertinent past medical history.  History reviewed. No pertinent surgical history.  Family  History  Problem Relation Age of Onset  . Hypertension Father   . Diabetes Maternal Grandmother   . Diabetes Maternal Grandfather     Social History   Socioeconomic History  . Marital status: Single    Spouse name: Not on file  . Number of children: Not on file  . Years of education: Not on file  . Highest education level: Not on file  Occupational History  . Not on file  Tobacco Use  . Smoking status: Never Smoker  . Smokeless tobacco: Never Used  Substance and Sexual Activity  . Alcohol use: Not on file  . Drug use: Not on file  . Sexual activity: Not on file  Other Topics Concern  . Not on file  Social History Narrative  . Not on file   Social Determinants of Health   Financial Resource Strain:   . Difficulty of Paying Living Expenses: Not on file  Food Insecurity:   . Worried About Charity fundraiser in the Last Year: Not on file  . Ran Out of Food in the Last Year: Not on file  Transportation Needs:   . Lack of Transportation (Medical): Not on file  . Lack of Transportation (Non-Medical): Not on file  Physical Activity:   . Days of Exercise per Week: Not on file  . Minutes of Exercise per Session: Not on file  Stress:   . Feeling of Stress : Not on file  Social Connections:   .  Frequency of Communication with Friends and Family: Not on file  . Frequency of Social Gatherings with Friends and Family: Not on file  . Attends Religious Services: Not on file  . Active Member of Clubs or Organizations: Not on file  . Attends Archivist Meetings: Not on file  . Marital Status: Not on file  Intimate Partner Violence:   . Fear of Current or Ex-Partner: Not on file  . Emotionally Abused: Not on file  . Physically Abused: Not on file  . Sexually Abused: Not on file    Outpatient Medications Prior to Visit  Medication Sig Dispense Refill  . cyclobenzaprine (FLEXERIL) 5 MG tablet Take 1-2 tablets (5-10 mg total) by mouth 3 (three) times daily as needed  for muscle spasms. 30 tablet 0  . amLODipine (NORVASC) 10 MG tablet Take 1 tablet (10 mg total) by mouth daily. 90 tablet 1  . gabapentin (NEURONTIN) 100 MG capsule Take 1-3 capsules (100-300 mg total) by mouth at bedtime. 90 capsule 0  . hydrochlorothiazide (HYDRODIURIL) 25 MG tablet Take 1 tablet (25 mg total) by mouth daily. 90 tablet 1  . meloxicam (MOBIC) 15 MG tablet Take 1 tablet (15 mg total) by mouth daily. (Patient not taking: Reported on 08/26/2019) 30 tablet 0  . predniSONE (DELTASONE) 20 MG tablet Take 1 tablet (20 mg total) by mouth 2 (two) times daily with a meal. (breakfst and lunch) (Patient not taking: Reported on 08/26/2019) 10 tablet 0   No facility-administered medications prior to visit.    Allergies  Allergen Reactions  . Latex Other (See Comments)    Unknown  . Lisinopril Cough      Objective:    Physical Exam Vitals and nursing note reviewed.  Constitutional:      Appearance: Normal appearance. She is normal weight.  HENT:     Head: Normocephalic.  Eyes:     Extraocular Movements: Extraocular movements intact.     Conjunctiva/sclera: Conjunctivae normal.     Pupils: Pupils are equal, round, and reactive to light.  Neck:     Vascular: No carotid bruit.  Cardiovascular:     Rate and Rhythm: Normal rate and regular rhythm.     Pulses: Normal pulses.     Heart sounds: Normal heart sounds.  Pulmonary:     Effort: Pulmonary effort is normal.     Breath sounds: Normal breath sounds.  Abdominal:     General: Abdomen is flat. Bowel sounds are normal.     Palpations: Abdomen is soft.  Musculoskeletal:        General: Normal range of motion.     Cervical back: Normal range of motion and neck supple.     Right lower leg: No edema.     Left lower leg: No edema.  Skin:    General: Skin is warm and dry.     Capillary Refill: Capillary refill takes less than 2 seconds.  Neurological:     General: No focal deficit present.     Mental Status: She is alert and  oriented to person, place, and time.  Psychiatric:        Mood and Affect: Mood normal.        Behavior: Behavior normal.        Thought Content: Thought content normal.        Judgment: Judgment normal.     BP (!) 196/89   Pulse 75   Temp 98.3 F (36.8 C) (Oral)   Ht 5\' 2"  (  1.575 m)   Wt 140 lb 4.8 oz (63.6 kg)   SpO2 98%   BMI 25.66 kg/m  Wt Readings from Last 3 Encounters:  08/26/19 140 lb 4.8 oz (63.6 kg)  01/14/19 140 lb 1.3 oz (63.5 kg)  12/06/18 146 lb 1.3 oz (66.3 kg)    There are no preventive care reminders to display for this patient.  There are no preventive care reminders to display for this patient.   Lab Results  Component Value Date   TSH 1.19 03/13/2018   Lab Results  Component Value Date   WBC 6.9 03/13/2018   HGB 12.6 03/13/2018   HCT 36.7 03/13/2018   MCV 92.0 03/13/2018   PLT 248 03/13/2018   Lab Results  Component Value Date   NA 140 03/13/2018   K 3.8 03/13/2018   CO2 29 03/13/2018   GLUCOSE 111 (H) 03/13/2018   BUN 12 03/13/2018   CREATININE 0.71 03/13/2018   BILITOT 0.7 03/13/2018   AST 13 03/13/2018   ALT 16 03/13/2018   PROT 7.2 03/13/2018   CALCIUM 9.4 03/13/2018   Lab Results  Component Value Date   CHOL 263 (H) 03/13/2018   Lab Results  Component Value Date   HDL 50 03/13/2018   Lab Results  Component Value Date   LDLCALC 176 (H) 03/13/2018   Lab Results  Component Value Date   TRIG 209 (H) 03/13/2018   Lab Results  Component Value Date   CHOLHDL 5.3 (H) 03/13/2018   No results found for: HGBA1C     Assessment & Plan:   Problem List Items Addressed This Visit      Cardiovascular and Mediastinum   Essential hypertension    Significant elevated in blood pressure despite medication management with Norvasc 10 mg and hydrochlorothiazide 25 mg/day.  Blood pressure is significantly elevated in the office today with first assessment 193/90, second assessment 196/89. I do feel that her hypertension is causing a  significant amount of her weakness, and dizziness, and overall feeling of fatigue and unwell. Gust at length with the patient the importance of proper blood pressure management and having her blood pressure under better control to help reduce her risks of cardiovascular disease, stroke, heart attack. 0.1 mg of chlorthalidone given in the office today to help reduce blood pressure under monitored and controlled conditions.  Patient does have a driver with her today to take her home. Lan to continue Norvasc 10 mg daily and add chlorthalidone 25 mg daily.  Will discontinue hydrochlorothiazide at this time. Scription provided for blood pressure cuff for patient.  Instructions to monitor blood pressure twice a day for the next 2 weeks and bring these readings into a follow-up visit in 2 weeks for evaluation of blood pressure. Blood pressure remains elevated at this time will consider adding losartan to her medication regimen. We will obtain labs today to monitor CBC CMP and lipids as she is severely overdue for these.      Relevant Medications   chlorthalidone (HYGROTON) 25 MG tablet   amLODipine (NORVASC) 10 MG tablet   Other Relevant Orders   CBC with Differential   COMPLETE METABOLIC PANEL WITH GFR   Lipid panel     Other   Nocturnal leg cramps    Symptoms and presentation consistent with nocturnal leg cramping and pain associated with this.  We will check electrolytes today.  She has been on HCTZ for quite some time and it has been quite a while since labs have been  checked.  We will refill her gabapentin today to help with pain and help keep the cramping a little more under control. Discussed with patient the importance of eating a well-balanced diet and including plenty of potassium. We will make changes to plan of care based on results from labs.      Relevant Medications   gabapentin (NEURONTIN) 100 MG capsule    Other Visit Diagnoses    Need for Tdap vaccination    -  Primary    Relevant Orders   Tdap vaccine greater than or equal to 7yo IM (Completed)       Meds ordered this encounter  Medications  . AMBULATORY NON FORMULARY MEDICATION    Sig: Blood pressure monitor.    Dispense:  1 Device    Refill:  0  . chlorthalidone (HYGROTON) 25 MG tablet    Sig: Take 1 tablet (25 mg total) by mouth daily.    Dispense:  90 tablet    Refill:  0  . amLODipine (NORVASC) 10 MG tablet    Sig: Take 1 tablet (10 mg total) by mouth daily.    Dispense:  90 tablet    Refill:  0  . gabapentin (NEURONTIN) 100 MG capsule    Sig: Take 1-3 capsules (100-300 mg total) by mouth at bedtime.    Dispense:  90 capsule    Refill:  0    No refills. Due for annual visit with Provider.   Follow-up in 2 weeks for reevaluation of hypertension after change from hydrochlorothiazide to chlorthalidone.  She is in need of annual physical exam with PCP.  Will obtain labs today which hopefully will be useful for this.  Greater than 50 minutes spent with face-to-face time with the patient due to intensive blood pressure monitoring, discussion of current condition and illness, and evaluation.  Orma Render, NP

## 2019-08-26 NOTE — Patient Instructions (Addendum)
I am going to change her blood pressure medication today.  You will still take the Norvasc 10 mg every day.  I am also adding a medication called chlorthalidone 25 mg and you will take that every day with the Norvasc.  We are going to stop the hydrochlorothiazide today.  Abs today to see what your electrolytes look like.  I do believe that this could be causing the pain in your legs.  I would like you to check your blood pressure twice a day for the next 2 weeks.  Please check your blood pressure in the morning when you are calm and relaxed before you have any caffeine and then check it again before bed when you are calm and relaxed.  Please write these blood pressure readings down so that you can bring them back and we can go over them to make sure the medicine is working like it should.  I would like to you to monitor your sodium intake and keep it in an extreme minimum.  You may use seasoning such as Mrs. Dash in place of salt to help reduce the sodium intake.  Also please pay attention to things like soda and prepackaged foods as the sodium intake in these items can be significant and these can contribute to elevation in your blood pressure.  Please be sure to take your blood pressure medicine every single morning and do not skip doses.  I would like you to follow-up in 2 weeks and bring your blood pressure readings from home so that we can evaluate the effectiveness of your new medication and determine if we need to make any changes.  If you begin to have symptoms of weakness, severe headache, dizziness, or changes in mental status please let us know immediately these can be signs that your blood pressure is severely elevated.

## 2019-08-26 NOTE — Assessment & Plan Note (Signed)
Symptoms and presentation consistent with nocturnal leg cramping and pain associated with this.  We will check electrolytes today.  She has been on HCTZ for quite some time and it has been quite a while since labs have been checked.  We will refill her gabapentin today to help with pain and help keep the cramping a little more under control. Discussed with patient the importance of eating a well-balanced diet and including plenty of potassium. We will make changes to plan of care based on results from labs.

## 2019-08-26 NOTE — Assessment & Plan Note (Addendum)
Significant elevated in blood pressure despite medication management with Norvasc 10 mg and hydrochlorothiazide 25 mg/day.  Blood pressure is significantly elevated in the office today with first assessment 193/90, second assessment 196/89. I do feel that her hypertension is causing a significant amount of her weakness, and dizziness, and overall feeling of fatigue and unwell. Gust at length with the patient the importance of proper blood pressure management and having her blood pressure under better control to help reduce her risks of cardiovascular disease, stroke, heart attack. 0.1 mg of chlorthalidone given in the office today to help reduce blood pressure under monitored and controlled conditions.  Patient does have a driver with her today to take her home.  Replete blood pressure reading 180/81 which is much improved from her initial blood pressure readings.  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 Plan to continue Norvasc 10 mg daily and add chlorthalidone 25 mg daily.  Will discontinue hydrochlorothiazide at this time. Scription provided for blood pressure cuff for patient.  Instructions to monitor blood pressure twice a day for the next 2 weeks and bring these readings into a follow-up visit in 2 weeks for evaluation of blood pressure. Blood pressure remains elevated at this time will consider adding losartan to her medication regimen. We will obtain labs today to monitor CBC CMP and lipids as she is severely overdue for these.

## 2019-08-27 ENCOUNTER — Other Ambulatory Visit: Payer: Self-pay | Admitting: Nurse Practitioner

## 2019-08-27 DIAGNOSIS — E782 Mixed hyperlipidemia: Secondary | ICD-10-CM

## 2019-08-27 LAB — CBC WITH DIFFERENTIAL/PLATELET
Absolute Monocytes: 360 cells/uL (ref 200–950)
Basophils Absolute: 43 cells/uL (ref 0–200)
Basophils Relative: 0.6 %
Eosinophils Absolute: 43 cells/uL (ref 15–500)
Eosinophils Relative: 0.6 %
HCT: 36.4 % (ref 35.0–45.0)
Hemoglobin: 12.4 g/dL (ref 11.7–15.5)
Lymphs Abs: 2189 cells/uL (ref 850–3900)
MCH: 31 pg (ref 27.0–33.0)
MCHC: 34.1 g/dL (ref 32.0–36.0)
MCV: 91 fL (ref 80.0–100.0)
MPV: 9.7 fL (ref 7.5–12.5)
Monocytes Relative: 5 %
Neutro Abs: 4565 cells/uL (ref 1500–7800)
Neutrophils Relative %: 63.4 %
Platelets: 244 10*3/uL (ref 140–400)
RBC: 4 10*6/uL (ref 3.80–5.10)
RDW: 11.6 % (ref 11.0–15.0)
Total Lymphocyte: 30.4 %
WBC: 7.2 10*3/uL (ref 3.8–10.8)

## 2019-08-27 LAB — COMPLETE METABOLIC PANEL WITH GFR
AG Ratio: 1.5 (calc) (ref 1.0–2.5)
ALT: 14 U/L (ref 6–29)
AST: 16 U/L (ref 10–35)
Albumin: 4.4 g/dL (ref 3.6–5.1)
Alkaline phosphatase (APISO): 71 U/L (ref 37–153)
BUN: 12 mg/dL (ref 7–25)
CO2: 29 mmol/L (ref 20–32)
Calcium: 9.6 mg/dL (ref 8.6–10.4)
Chloride: 100 mmol/L (ref 98–110)
Creat: 0.7 mg/dL (ref 0.60–0.93)
GFR, Est African American: 100 mL/min/{1.73_m2} (ref >=60)
GFR, Est Non African American: 87 mL/min/{1.73_m2} (ref >=60)
Globulin: 3 g/dL (calc) (ref 1.9–3.7)
Glucose, Bld: 100 mg/dL (ref 65–139)
Potassium: 4.4 mmol/L (ref 3.5–5.3)
Sodium: 137 mmol/L (ref 135–146)
Total Bilirubin: 1 mg/dL (ref 0.2–1.2)
Total Protein: 7.4 g/dL (ref 6.1–8.1)

## 2019-08-27 LAB — LIPID PANEL
Cholesterol: 311 mg/dL — ABNORMAL HIGH (ref <200)
HDL: 57 mg/dL (ref >=50)
LDL Cholesterol (Calc): 216 mg/dL (calc) — ABNORMAL HIGH
Non-HDL Cholesterol (Calc): 254 mg/dL (calc) — ABNORMAL HIGH (ref <130)
Total CHOL/HDL Ratio: 5.5 (calc) — ABNORMAL HIGH (ref <5.0)
Triglycerides: 196 mg/dL — ABNORMAL HIGH (ref <150)

## 2019-08-27 MED ORDER — ROSUVASTATIN CALCIUM 20 MG PO TABS: ORAL_TABLET | ORAL | 0 refills | Status: DC

## 2019-08-27 NOTE — Progress Notes (Signed)
Your electrolytes, liver, and kidney function all look ok, so this is not likely the cause of your nighttime leg pain.   Your cholesterol is very elevated. This, along with your elevated blood pressure puts you at a very high risk of heart attack and stroke. We need to be aggressive to get this under better control to help reduce these risks. I would like to start you on a medication that will help lower your cholesterol.   I have called in a prescription for a medication called Rosuvastatin (Crestor). Please take this every night at bedtime. We will need to recheck these levels in about 3 months to make sure that the numbers are decreasing. We can talk about this more when you come back for your blood pressure in 2 weeks, if you would like.

## 2019-09-09 ENCOUNTER — Encounter: Payer: Self-pay | Admitting: Osteopathic Medicine

## 2019-09-09 ENCOUNTER — Ambulatory Visit: Payer: Medicare Other | Admitting: Nurse Practitioner

## 2019-09-11 ENCOUNTER — Ambulatory Visit: Payer: Medicare Other | Admitting: Nurse Practitioner

## 2019-09-15 ENCOUNTER — Telehealth: Payer: Self-pay

## 2019-09-15 NOTE — Telephone Encounter (Signed)
I am not sure where the grocery store got this information. This is not a medication that I recommend for patients. CoQ10 is sometimes taken by patients who have muscle pain associated with statin use, but has not been shown to provide much benefit.

## 2019-09-15 NOTE — Telephone Encounter (Signed)
Patient called stating she was told by the grocery store that her provider recommended she take CoQ10. She is unsure of the dosing, stating there are several option.   Jill Bernard, I looked at office note and labs but I did not see where you had noted this (I might have overlooked it?).   Can you please look and let me know?

## 2019-09-16 NOTE — Telephone Encounter (Signed)
Patient advised.

## 2019-09-19 ENCOUNTER — Ambulatory Visit: Payer: Medicare Other | Admitting: Nurse Practitioner

## 2019-09-22 ENCOUNTER — Other Ambulatory Visit: Payer: Self-pay | Admitting: Nurse Practitioner

## 2019-09-22 DIAGNOSIS — G4762 Sleep related leg cramps: Secondary | ICD-10-CM

## 2019-10-01 DIAGNOSIS — Z20822 Contact with and (suspected) exposure to covid-19: Secondary | ICD-10-CM | POA: Diagnosis not present

## 2019-10-28 ENCOUNTER — Telehealth: Payer: Self-pay

## 2019-10-28 NOTE — Telephone Encounter (Signed)
Patient called requesting pain medication be called in because for the last couple days she has had a lot of leg pain. States her legs "just hurt" but unable to elaborate.   I advised patient there are several openings today and she needs to be evaluated for leg pain prior to any medication being sent in, to make sure that we are treating correctly.  Patient okay and agreeable, is going to see if she can find a ride then will call back and schedule. Declined virtual.

## 2019-10-30 DIAGNOSIS — I10 Essential (primary) hypertension: Secondary | ICD-10-CM | POA: Diagnosis not present

## 2019-10-30 DIAGNOSIS — M4726 Other spondylosis with radiculopathy, lumbar region: Secondary | ICD-10-CM | POA: Diagnosis not present

## 2019-10-30 DIAGNOSIS — M79605 Pain in left leg: Secondary | ICD-10-CM | POA: Diagnosis not present

## 2019-10-30 DIAGNOSIS — Z888 Allergy status to other drugs, medicaments and biological substances status: Secondary | ICD-10-CM | POA: Diagnosis not present

## 2019-10-30 DIAGNOSIS — M25552 Pain in left hip: Secondary | ICD-10-CM | POA: Diagnosis not present

## 2019-10-30 DIAGNOSIS — Z91040 Latex allergy status: Secondary | ICD-10-CM | POA: Diagnosis not present

## 2019-10-30 DIAGNOSIS — M47896 Other spondylosis, lumbar region: Secondary | ICD-10-CM | POA: Diagnosis not present

## 2019-10-31 DIAGNOSIS — M79662 Pain in left lower leg: Secondary | ICD-10-CM | POA: Diagnosis not present

## 2019-10-31 DIAGNOSIS — M25562 Pain in left knee: Secondary | ICD-10-CM | POA: Diagnosis not present

## 2019-10-31 DIAGNOSIS — S8992XA Unspecified injury of left lower leg, initial encounter: Secondary | ICD-10-CM | POA: Diagnosis not present

## 2019-10-31 DIAGNOSIS — M2559 Pain in other specified joint: Secondary | ICD-10-CM | POA: Diagnosis not present

## 2019-10-31 DIAGNOSIS — I10 Essential (primary) hypertension: Secondary | ICD-10-CM | POA: Diagnosis not present

## 2019-10-31 DIAGNOSIS — Z91040 Latex allergy status: Secondary | ICD-10-CM | POA: Diagnosis not present

## 2019-10-31 DIAGNOSIS — Z888 Allergy status to other drugs, medicaments and biological substances status: Secondary | ICD-10-CM | POA: Diagnosis not present

## 2019-11-07 ENCOUNTER — Ambulatory Visit (INDEPENDENT_AMBULATORY_CARE_PROVIDER_SITE_OTHER): Payer: Medicare Other | Admitting: Medical-Surgical

## 2019-11-07 ENCOUNTER — Encounter: Payer: Self-pay | Admitting: Medical-Surgical

## 2019-11-07 VITALS — BP 172/78 | HR 92 | Temp 98.4°F | Ht 62.0 in | Wt 137.7 lb

## 2019-11-07 DIAGNOSIS — M1712 Unilateral primary osteoarthritis, left knee: Secondary | ICD-10-CM | POA: Diagnosis not present

## 2019-11-07 DIAGNOSIS — M25562 Pain in left knee: Secondary | ICD-10-CM | POA: Diagnosis not present

## 2019-11-07 NOTE — Progress Notes (Signed)
    Procedures performed today:    Procedure:  Injection of left knee Consent obtained and verified. Time-out conducted. Noted no overlying erythema, induration, or other signs of local infection. Skin prepped in a sterile fashion. Topical analgesic spray: Ethyl chloride. Completed without difficulty. Meds: Using an anterolateral approach we entered the knee joint, we then injected 1 cc kenalog 40, 2 cc lidocaine, 2 cc bupivacaine. Advised to call if fevers/chills, erythema, induration, drainage, or persistent bleeding.  Independent interpretation of notes and tests performed by another provider:   None.  Brief History, Exam, Impression, and Recommendations:    Primary osteoarthritis of left knee Jill Bernard has been having significant pain in her left knee without trauma, she was seen in the ED, x-rays only showed osteoarthritis, she had some analgesics which provided insignificant relief. She was seeing Samuel Bouche, NP, I am called for further evaluation and definitive treatment. On exam she has significant tenderness at her anterior medial joint line, only minimal effusion. The knee joint is stable. Due to failure of conservative treatment we injected her knee today with triamcinolone, I would like to see her back in about a month. I did request updated x-rays today.    ___________________________________________ Gwen Her. Dianah Field, M.D., ABFM., CAQSM. Primary Care and [place] In[employer] [facility]

## 2019-11-07 NOTE — Assessment & Plan Note (Signed)
Jill Bernard has been having significant pain in her left knee without trauma, she was seen in the ED, x-rays only showed osteoarthritis, she had some analgesics which provided insignificant relief. She was seeing Samuel Bouche, NP, I am called for further evaluation and definitive treatment. On exam she has significant tenderness at her anterior medial joint line, only minimal effusion. The knee joint is stable. Due to failure of conservative treatment we injected her knee today with triamcinolone, I would like to see her back in about a month. I did request updated x-rays today.

## 2019-11-07 NOTE — Progress Notes (Signed)
Subjective:    CC: left leg pain  HPI: Pleasant 74 year old female presenting today with complaints of 2 weeks of left knee pain that radiates down the shin x 2 weeks. Denies injury. Woke up at 1:30am with the pain and has not had relief since then. She was seen at [facility] ED and prescribed Voltaren gel and Tramadol. These have not been helpful. Now the pain is so severe she is unable to bear weight to walk. She is currently using a Rollator walk and riding around sitting while either being pulled or pushing herself with her right leg. She has tried heat and Tylenol but these didn't help either.   I reviewed the past medical history, family history, social history, surgical history, and allergies today and no changes were needed.  Please see the problem list section below in epic for further details.  Past Medical History: History reviewed. No pertinent past medical history. Past Surgical History: History reviewed. No pertinent surgical history. Social History: Social History   Socioeconomic History  . Marital status: Single    Spouse name: Not on file  . Number of children: Not on file  . Years of education: Not on file  . Highest education level: Not on file  Occupational History  . Not on file  Tobacco Use  . Smoking status: Never Smoker  . Smokeless tobacco: Never Used  Substance and Sexual Activity  . Alcohol use: Not on file  . Drug use: Not on file  . Sexual activity: Not on file  Other Topics Concern  . Not on file  Social History Narrative  . Not on file   Social Determinants of Health   Financial Resource Strain:   . Difficulty of Paying Living Expenses: Not on file  Food Insecurity:   . Worried About Charity fundraiser in the Last Year: Not on file  . Ran Out of Food in the Last Year: Not on file  Transportation Needs:   . Lack of Transportation (Medical): Not on file  . Lack of Transportation (Non-Medical): Not on file  Physical Activity:   . Days of  Exercise per Week: Not on file  . Minutes of Exercise per Session: Not on file  Stress:   . Feeling of Stress : Not on file  Social Connections:   . Frequency of Communication with Friends and Family: Not on file  . Frequency of Social Gatherings with Friends and Family: Not on file  . Attends Religious Services: Not on file  . Active Member of Clubs or Organizations: Not on file  . Attends Archivist Meetings: Not on file  . Marital Status: Not on file   Family History: Family History  Problem Relation Age of Onset  . Hypertension Father   . Diabetes Maternal Grandmother   . Diabetes Maternal Grandfather    Allergies: Allergies  Allergen Reactions  . Latex Other (See Comments)    Unknown  . Lisinopril Cough   Medications: See med rec.  Review of Systems: See HPI for pertinent positives and negatives.   Objective:    General: Well Developed, well nourished, and in no acute distress.  Neuro: Alert and oriented x3.  HEENT: Normocephalic, atraumatic.  Skin: Warm and dry. Cardiac: Regular rate and rhythm, no murmurs rubs or gallops, no lower extremity edema.  Respiratory: Clear to auscultation bilaterally. Not using accessory muscles, speaking in full sentences. MSK: Full ROM of the left knee. Tenderness to palpation of the soft spot lateral to the  lower patellar tendon. Tenderness extending down to an osteophyte at the lower attachment of the patellar tendon.    Impression and Recommendations:    1. Acute pain of left knee Consulted with Dr. Darene Lamer. Getting x-rays today for further evaluation.  - DG Knee Complete 4 Views Left; Future  2. Primary osteoarthritis of left knee Refer to note from Dr. Darene Lamer.  Return in about 4 weeks (around 12/05/2019) for left knee pain with Dr. Darene Lamer . ___________________________________________ Clearnce Sorrel, DNP, APRN, FNP-BC Primary Care and [facility]

## 2019-11-10 ENCOUNTER — Telehealth: Payer: Self-pay

## 2019-11-10 DIAGNOSIS — M1712 Unilateral primary osteoarthritis, left knee: Secondary | ICD-10-CM

## 2019-11-10 NOTE — Telephone Encounter (Signed)
Cloie states she is still having knee pain. She states that Tylenol, Ibuprofen and Voltaren gel is not helping. Please advise.

## 2019-11-10 NOTE — Telephone Encounter (Signed)
Let us give it a full week, I'm also going to add some physical therapy including possible aquatic therapy.

## 2019-11-11 NOTE — Telephone Encounter (Signed)
Patient advised.

## 2019-11-21 ENCOUNTER — Other Ambulatory Visit: Payer: Self-pay | Admitting: Nurse Practitioner

## 2019-11-21 DIAGNOSIS — I10 Essential (primary) hypertension: Secondary | ICD-10-CM

## 2019-11-25 ENCOUNTER — Other Ambulatory Visit: Payer: Self-pay

## 2019-11-25 ENCOUNTER — Ambulatory Visit (INDEPENDENT_AMBULATORY_CARE_PROVIDER_SITE_OTHER): Payer: Medicare Other | Admitting: Sports Medicine

## 2019-11-25 ENCOUNTER — Ambulatory Visit (INDEPENDENT_AMBULATORY_CARE_PROVIDER_SITE_OTHER): Payer: Medicare Other

## 2019-11-25 DIAGNOSIS — M545 Low back pain, unspecified: Secondary | ICD-10-CM

## 2019-11-25 DIAGNOSIS — M1712 Unilateral primary osteoarthritis, left knee: Secondary | ICD-10-CM

## 2019-11-25 DIAGNOSIS — M25561 Pain in right knee: Secondary | ICD-10-CM | POA: Diagnosis not present

## 2019-11-25 DIAGNOSIS — M25861 Other specified joint disorders, right knee: Secondary | ICD-10-CM

## 2019-11-25 DIAGNOSIS — M5416 Radiculopathy, lumbar region: Secondary | ICD-10-CM

## 2019-11-25 IMAGING — DX DG KNEE 1-2V*R*
2 series · 2 of 2 positions shown · non-contrast
Comparison: None.

CLINICAL DATA: Knee pain

EXAM:
RIGHT KNEE - 1-2 VIEW

[tunnel]
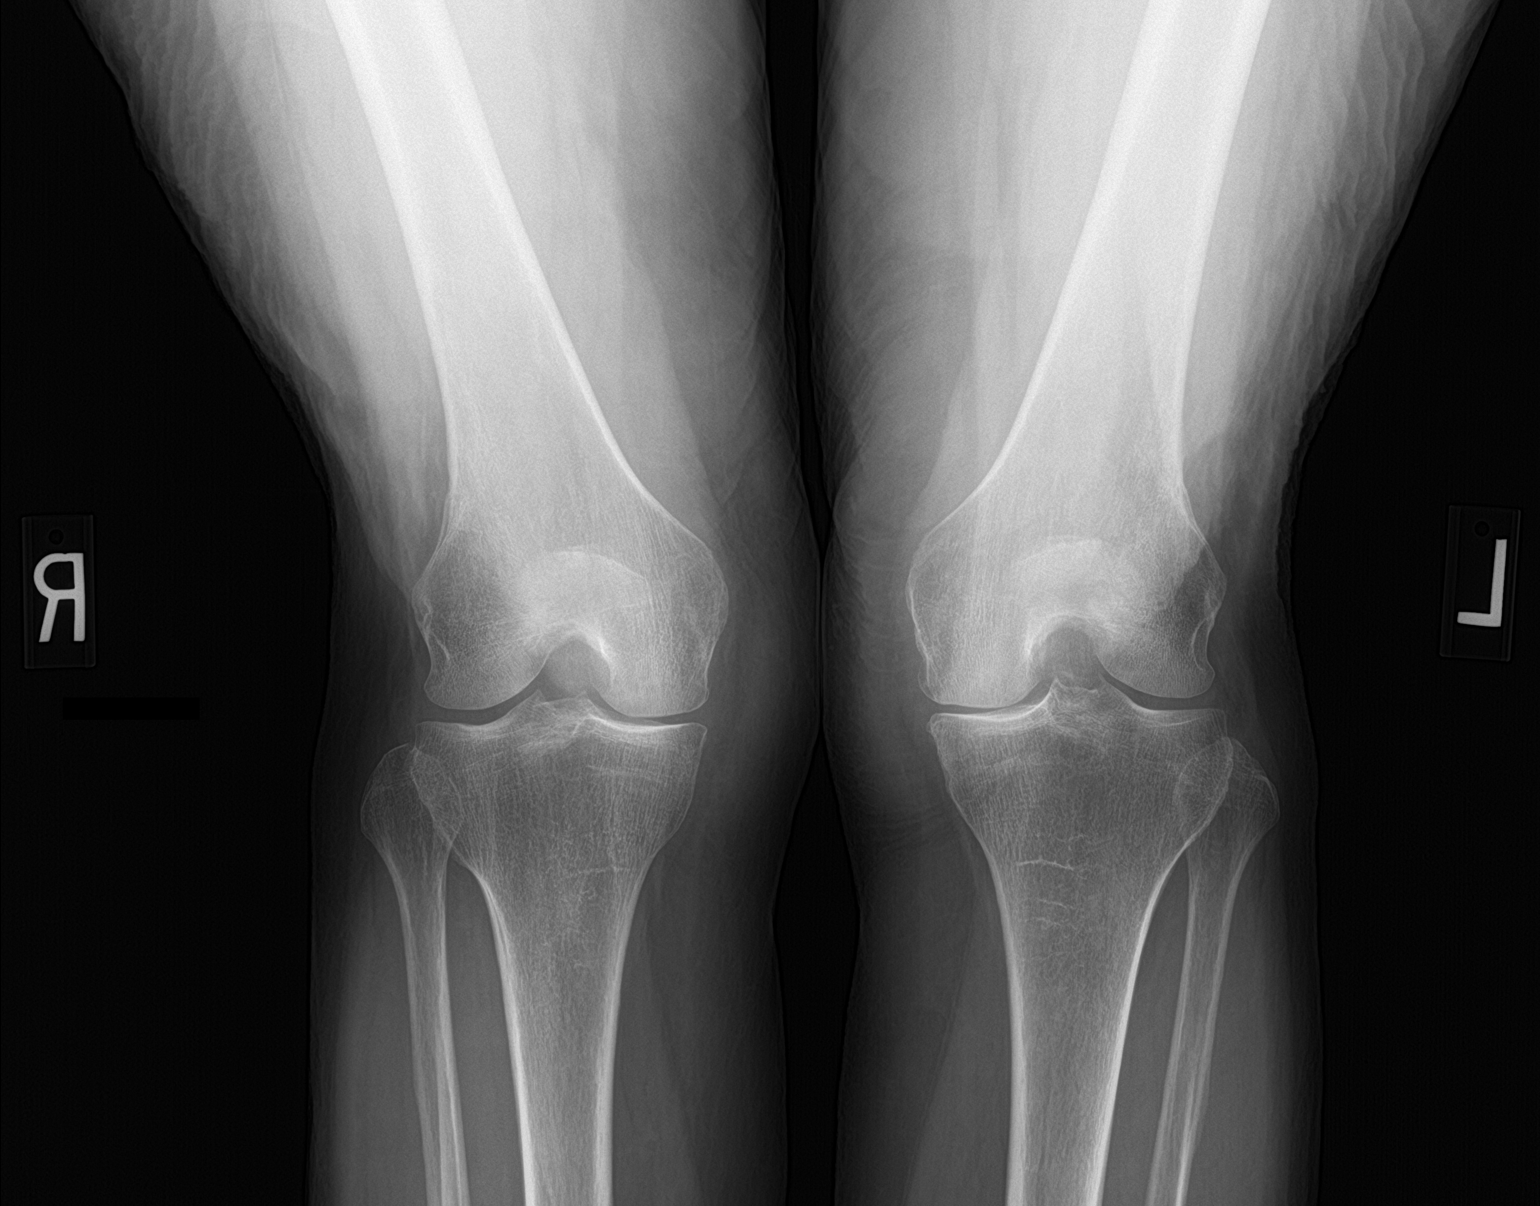

[knee ap bilat standing]
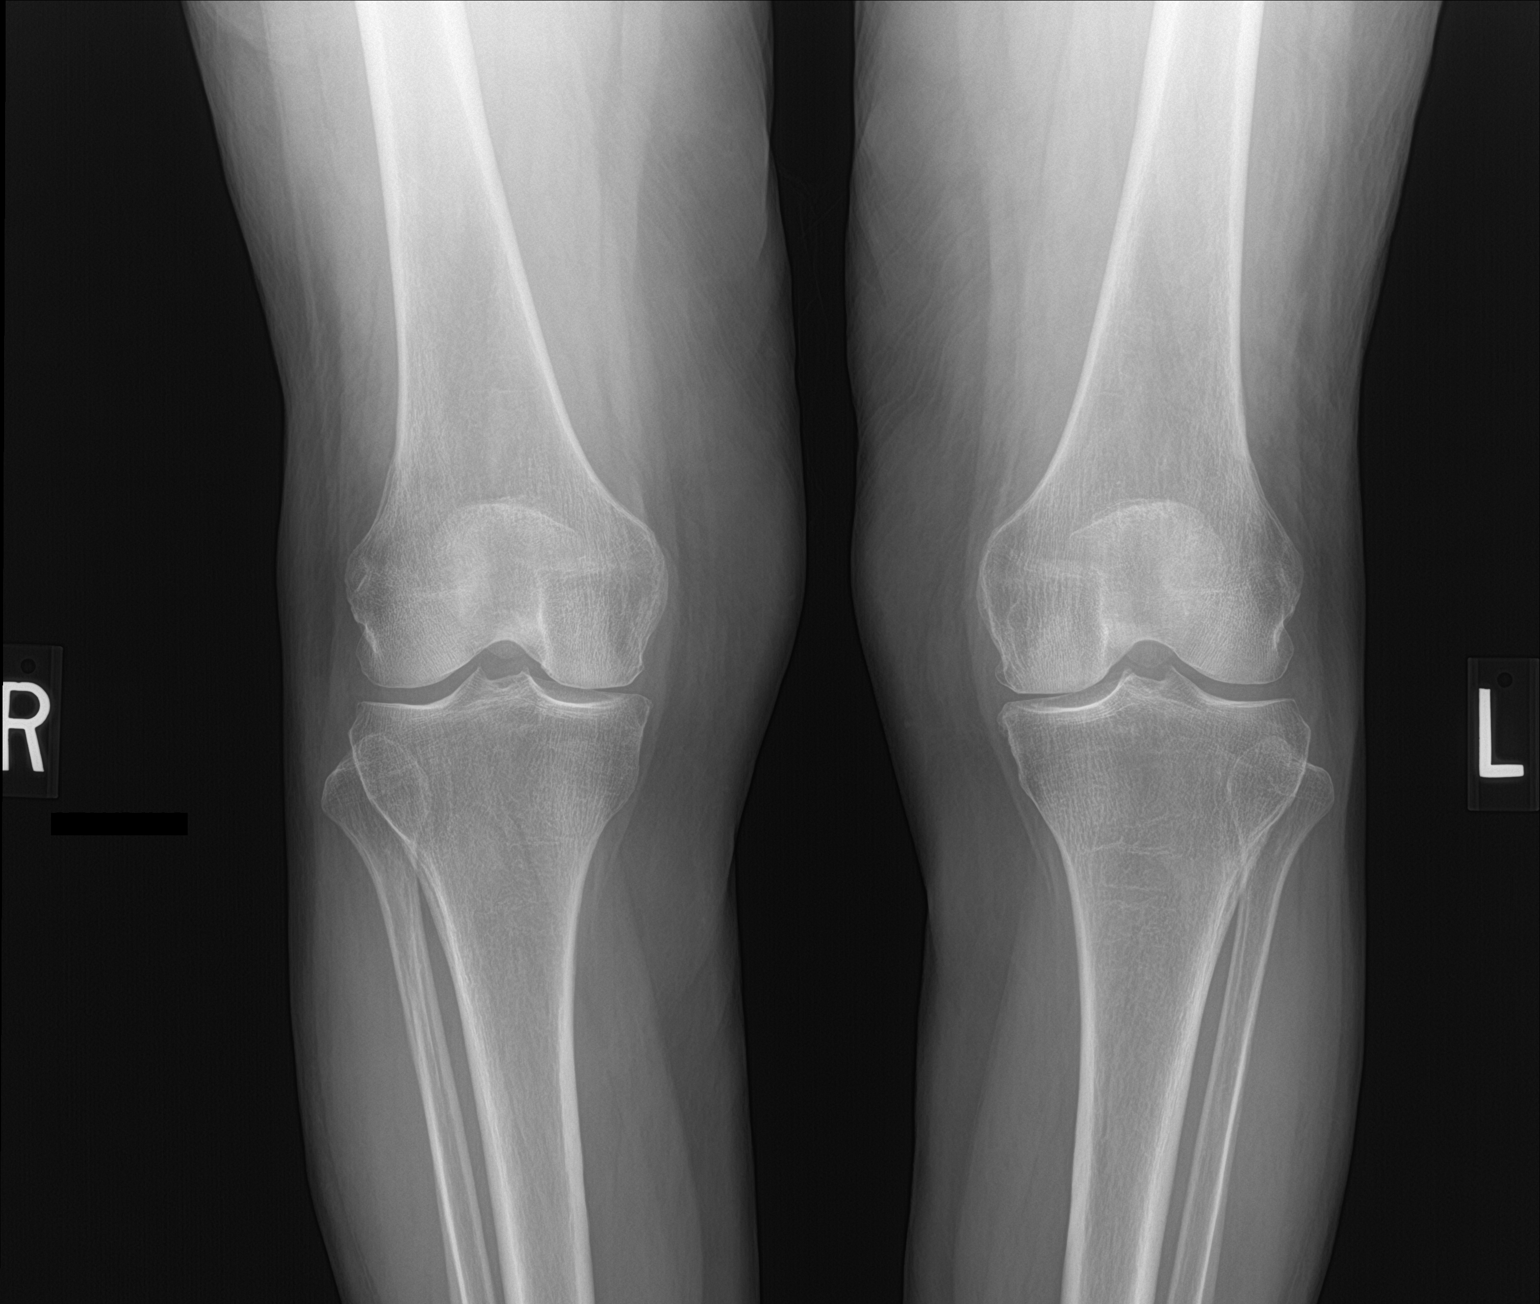

[2 of 2 positions shown; findings below may reference images not displayed]

FINDINGS: Mild medial joint space narrowing.  No obvious fracture.
IMPRESSION: Mild medial joint space narrowing.

## 2019-11-25 IMAGING — DX DG KNEE COMPLETE 4+V*L*
4 series · 4 of 4 positions shown · non-contrast
Comparison: None.

CLINICAL DATA: Lower extremity pain

EXAM:
LEFT KNEE - COMPLETE 4+ VIEW

[tunnel]
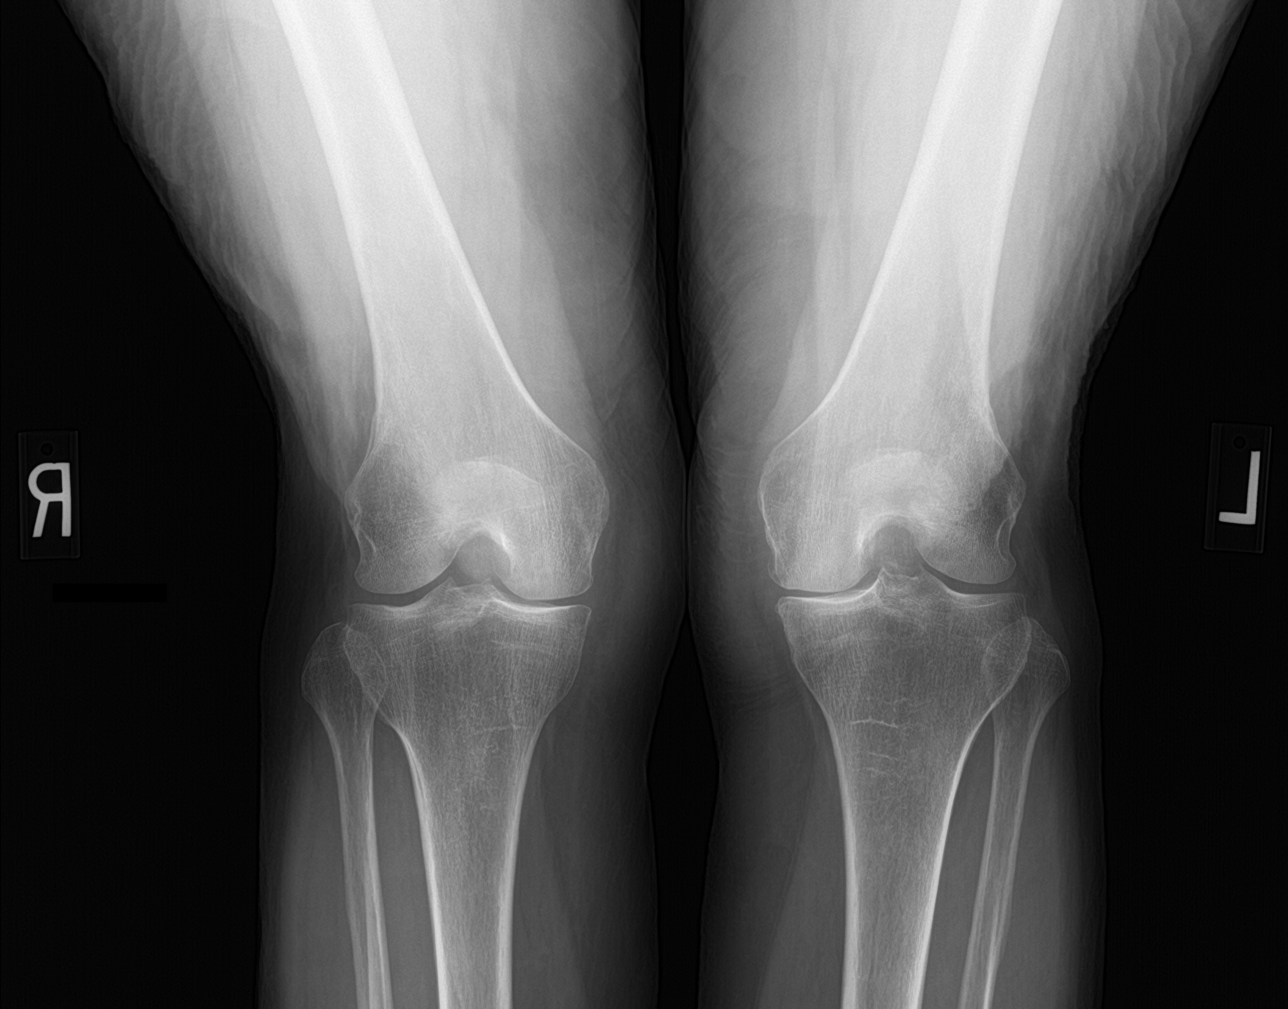

[knee lat]
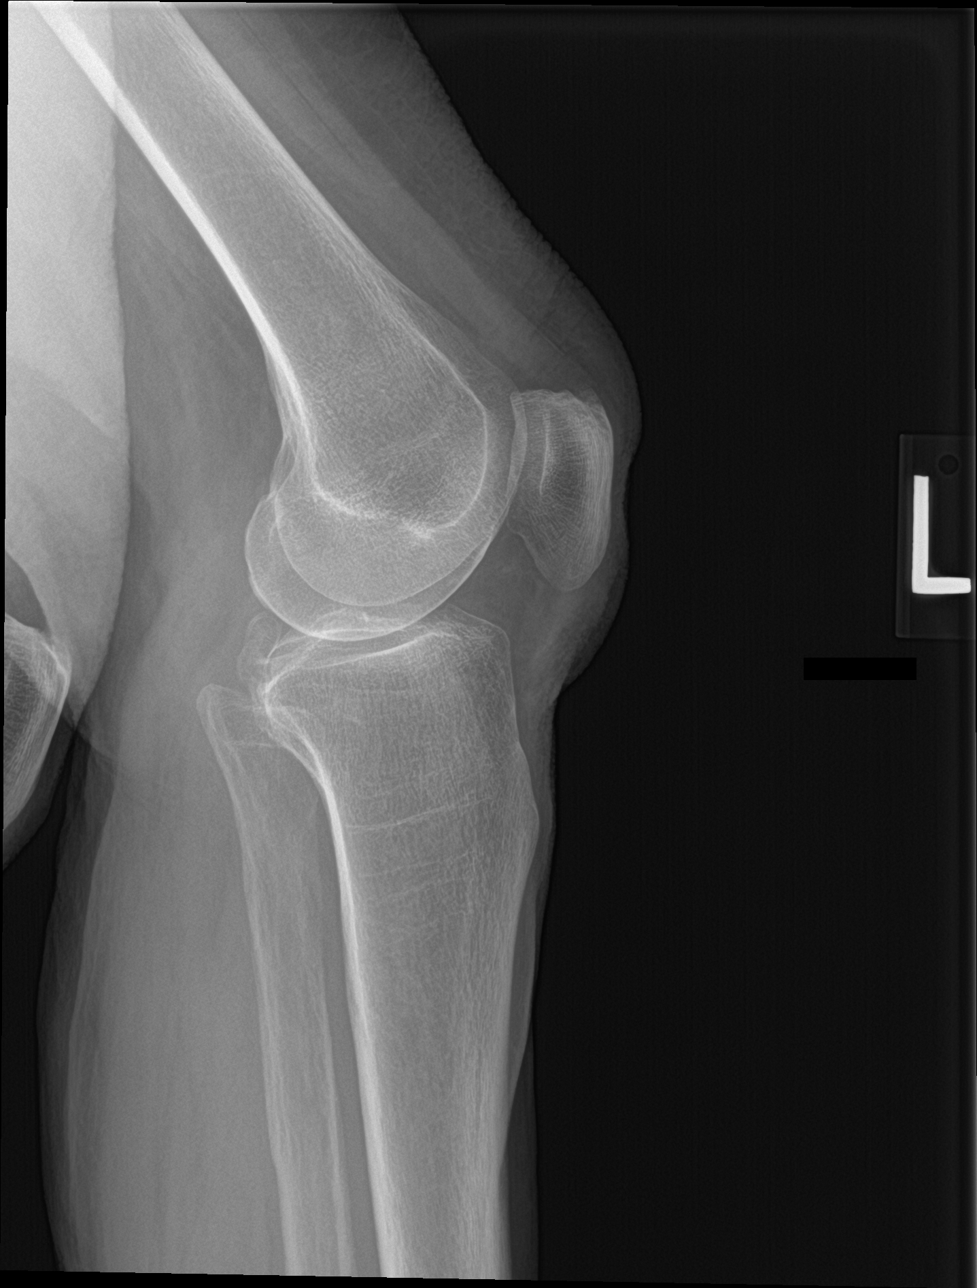

[knee sunrise]
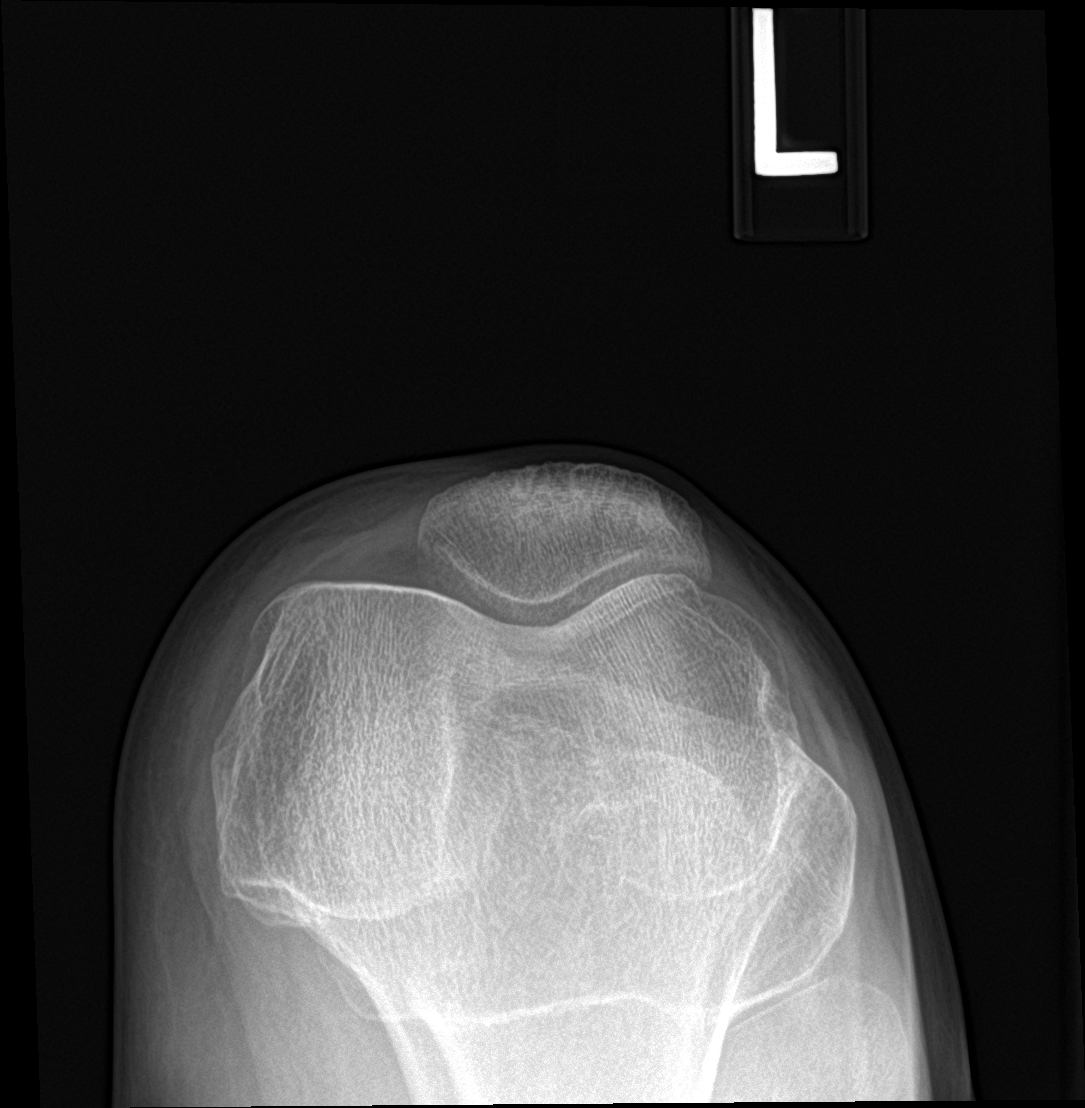

[knee ap bilat standing]
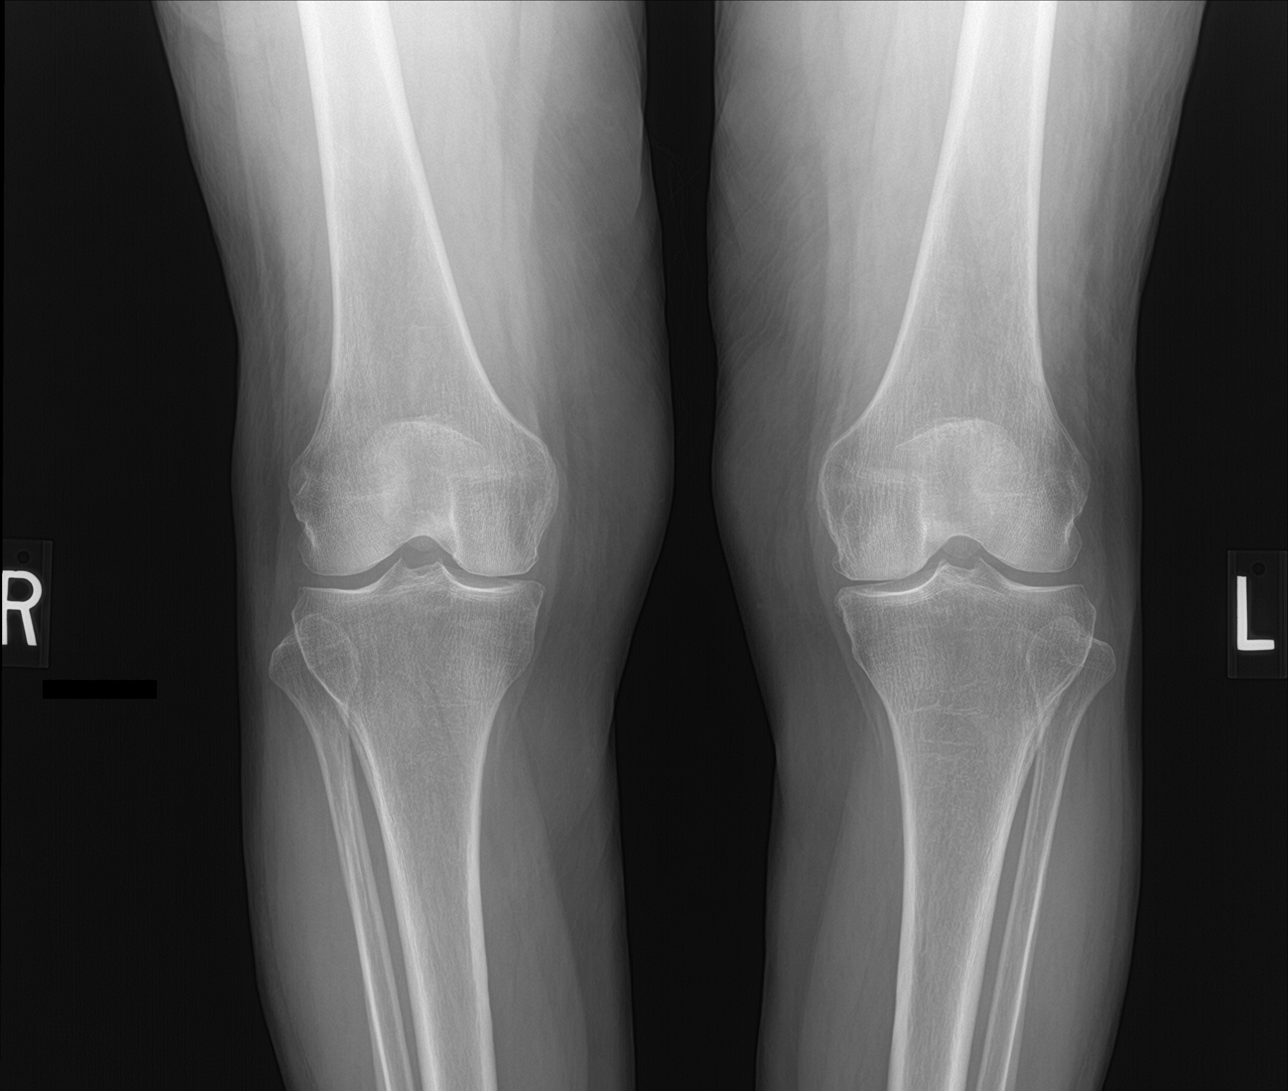

[4 of 4 positions shown; findings below may reference images not displayed]

FINDINGS: No fracture or malalignment. Mild medial joint space degenerative
change. No significant knee effusion
IMPRESSION: Mild degenerative change. No acute osseous abnormality.

## 2019-11-25 IMAGING — DX DG LUMBAR SPINE COMPLETE 4+V
5 series · 5 of 5 positions shown · non-contrast
Comparison: [DATE]

CLINICAL DATA: Back pain

EXAM:
LUMBAR SPINE - COMPLETE 4+ VIEW

[l-spine ap]
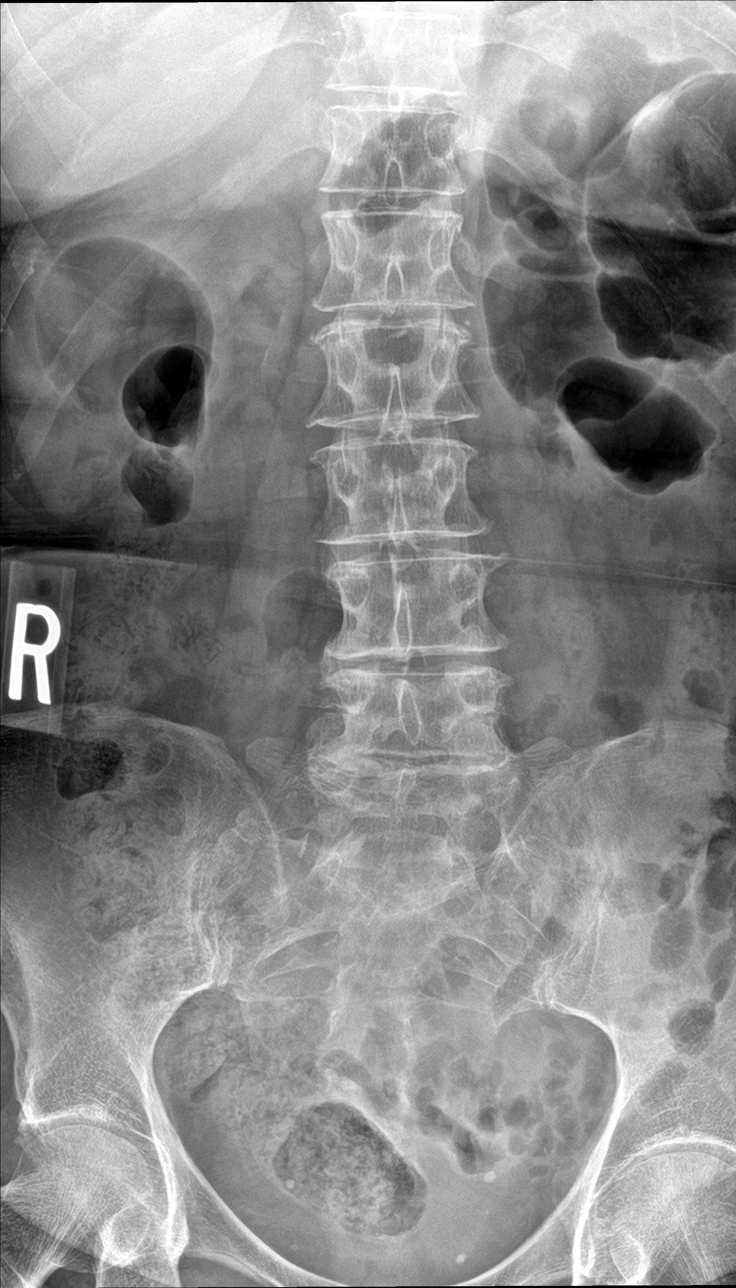

[l-spine obl (1 of 2)]
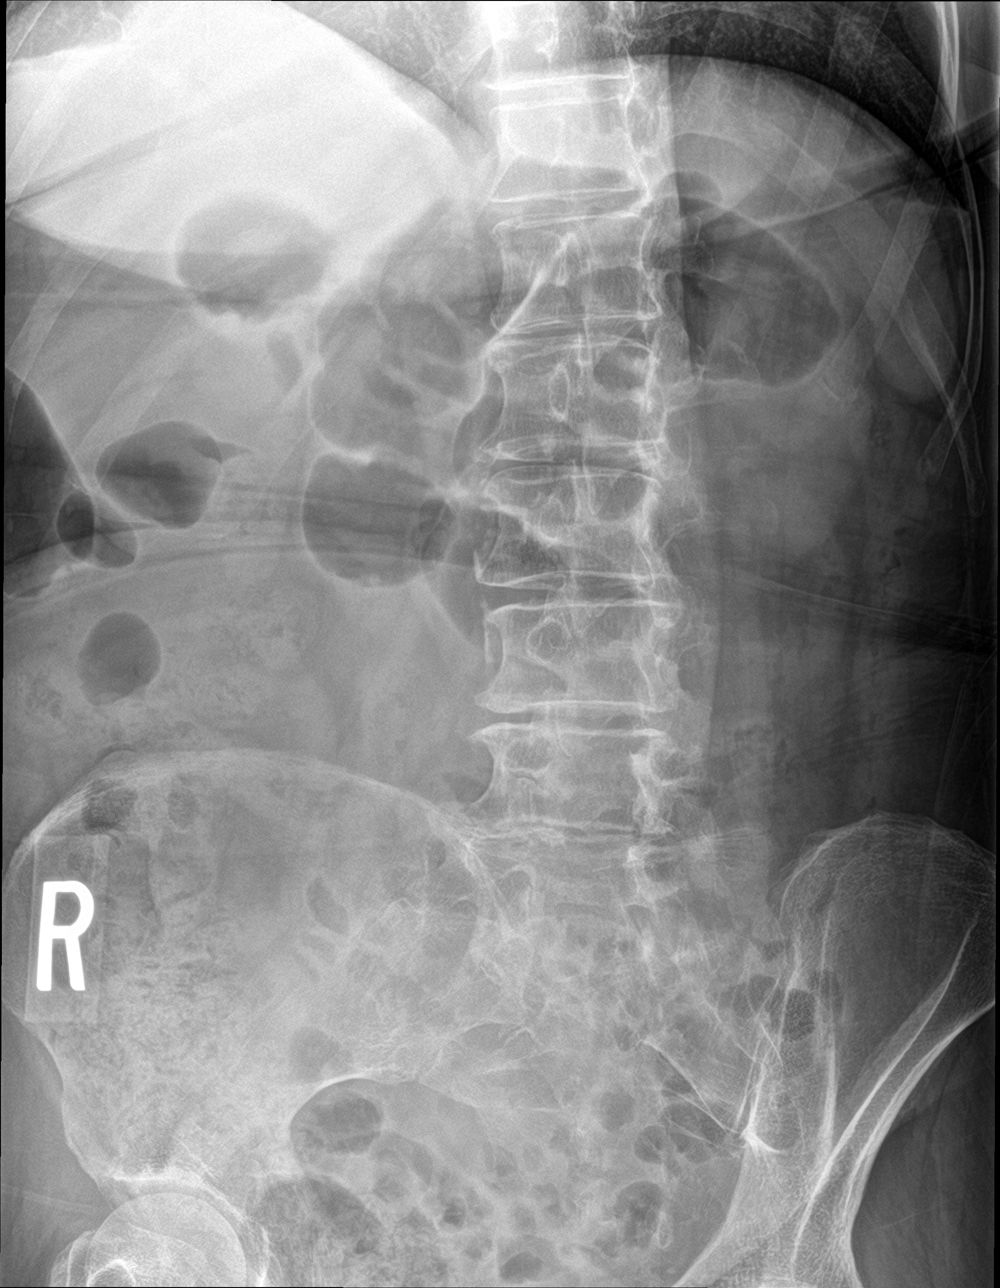

[l-spine obl (2 of 2)]
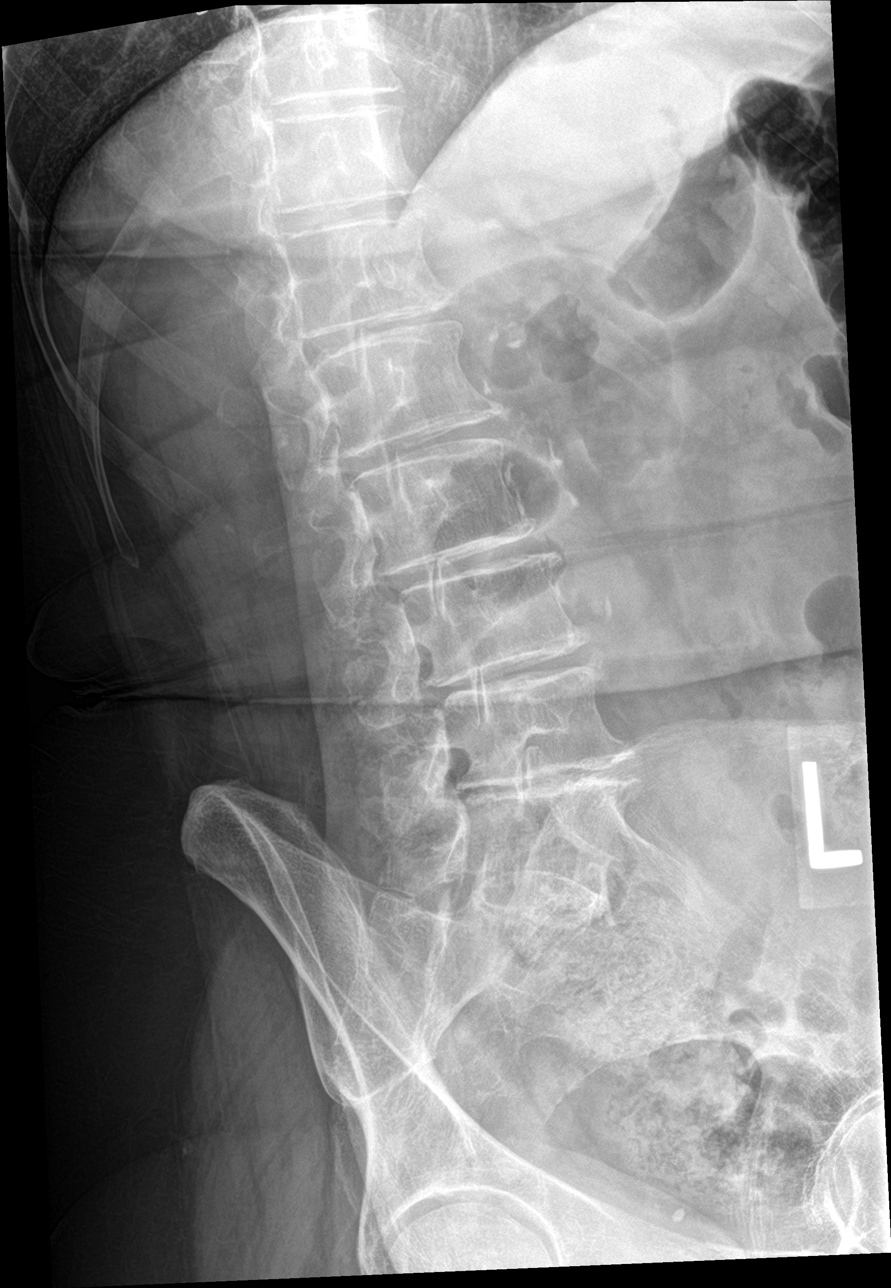

[l-spine lat]
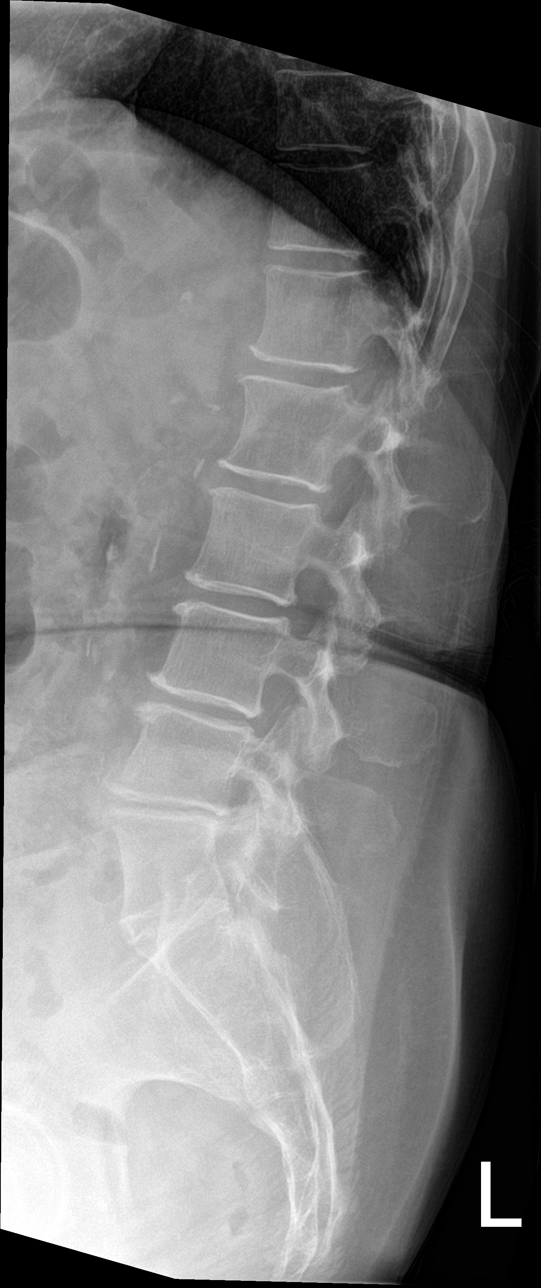

[l-spine spot]
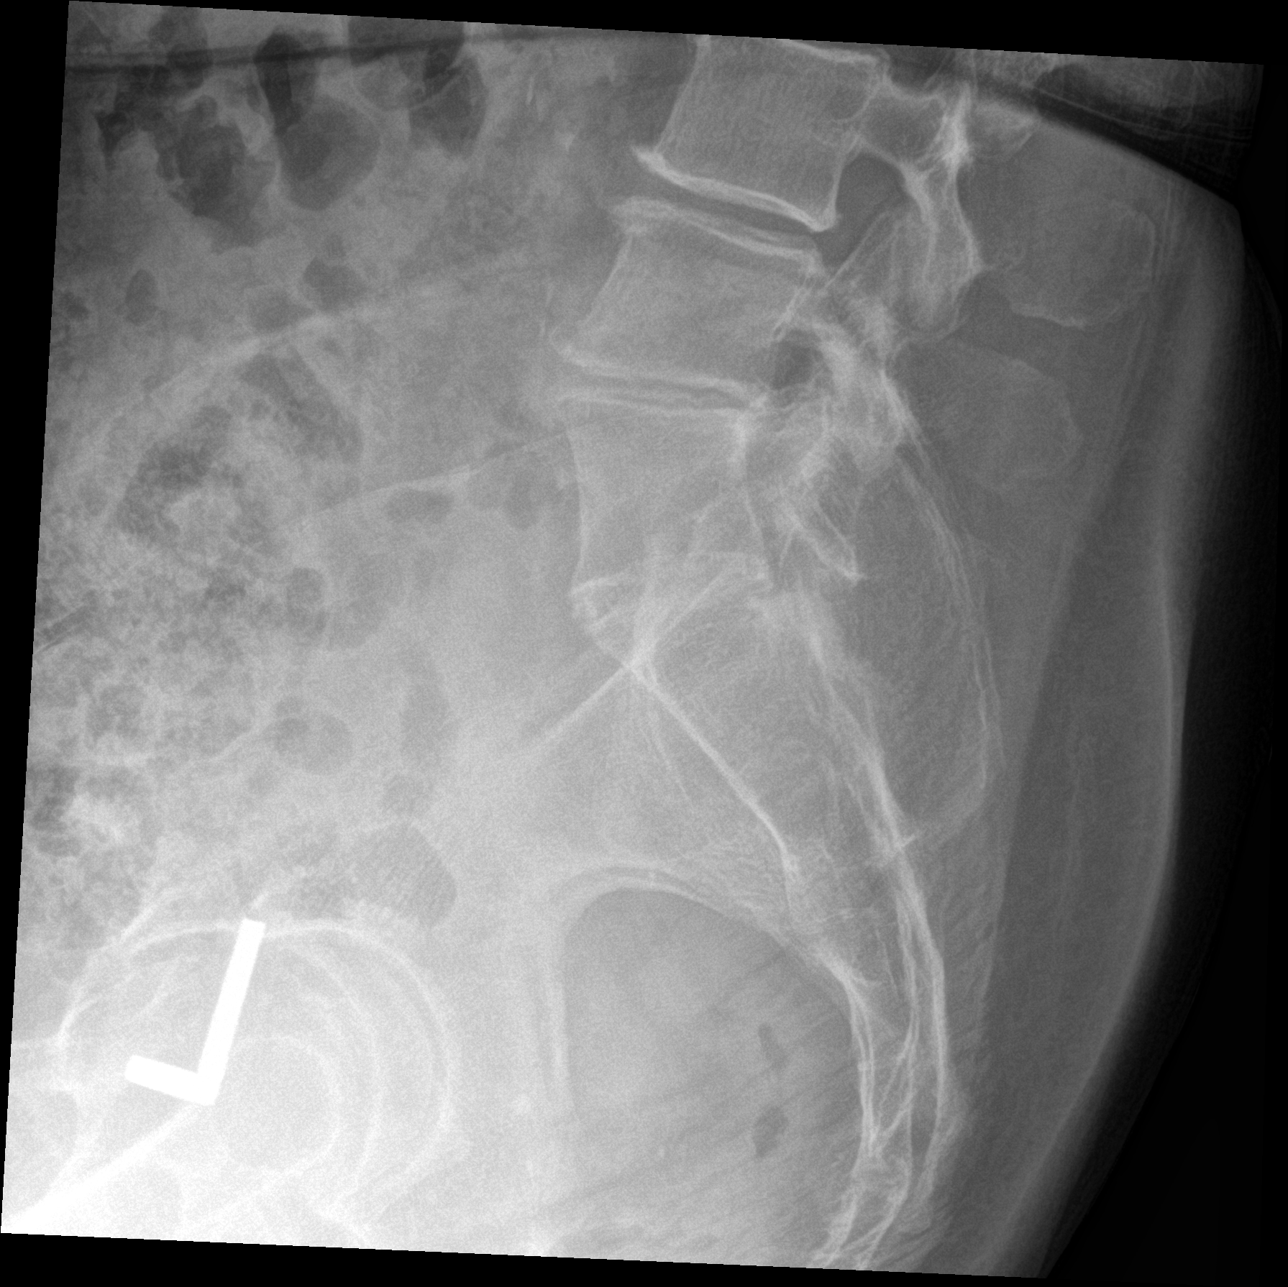

[5 of 5 positions shown; findings below may reference images not displayed]

FINDINGS: This report assumes hypoplastic ribs at T12. Stable lumbar
alignment. Vertebral body heights are maintained. Diffuse
degenerative changes throughout the lumbar spine with moderate disc
space narrowing at L4-L5 and L5-S1. Osteophytes at multiple levels.
Facet degenerative change of the lower lumbar spine. Aortic
atherosclerosis.
IMPRESSION: No acute osseous abnormality. Diffuse degenerative changes.

## 2019-11-25 MED ORDER — GABAPENTIN 300 MG PO CAPS: ORAL_CAPSULE | ORAL | 3 refills | Status: DC

## 2019-11-25 NOTE — Assessment & Plan Note (Signed)
Jill Bernard has severe pain in her left knee, no trauma, she has been seen in the ED in the past x-rays only showed osteoarthritis. At the last visit she had an injection. She only got maybe a matter of hours of relief from the injection. No constitutional symptoms, today we are going to add a knee MRI for surgical planning. She does have some tramadol already.

## 2019-11-25 NOTE — Assessment & Plan Note (Signed)
This is a bit of a complex case, in addition to her knee pain she also has aching coming down her thigh, knee, and anterior lower leg. X-rays of her lumbar spine in the past have shown degenerative disc disease, because of failure of greater than 6 weeks of conservative treatment we are going to proceed with an MRI of her lumbar spine, for epidural planning. Adding some gabapentin, she has not been taking the 1 that she has on her med list.

## 2019-11-25 NOTE — Progress Notes (Signed)
    Procedures performed today:    None.  Independent interpretation of notes and tests performed by another provider:   None.  Brief History, Exam, Impression, and Recommendations:    Primary osteoarthritis of left knee Jill Bernard has severe pain in her left knee, no trauma, she has been seen in the ED in the past x-rays only showed osteoarthritis. At the last visit she had an injection. She only got maybe a matter of hours of relief from the injection. No constitutional symptoms, today we are going to add a knee MRI for surgical planning. She does have some tramadol already.  Left lumbar radiculopathy This is a bit of a complex case, in addition to her knee pain she also has aching coming down her thigh, knee, and anterior lower leg. X-rays of her lumbar spine in the past have shown degenerative disc disease, because of failure of greater than 6 weeks of conservative treatment we are going to proceed with an MRI of her lumbar spine, for epidural planning. Adding some gabapentin, she has not been taking the 1 that she has on her med list.    ___________________________________________ Gwen Her. Dianah Field, M.D., ABFM., CAQSM. Primary Care and [employer] Instructor of [employer] [facility] [employer]

## 2019-11-30 ENCOUNTER — Ambulatory Visit (INDEPENDENT_AMBULATORY_CARE_PROVIDER_SITE_OTHER): Payer: Medicare Other

## 2019-11-30 ENCOUNTER — Other Ambulatory Visit: Payer: Self-pay

## 2019-11-30 DIAGNOSIS — M25562 Pain in left knee: Secondary | ICD-10-CM | POA: Diagnosis not present

## 2019-11-30 DIAGNOSIS — M5126 Other intervertebral disc displacement, lumbar region: Secondary | ICD-10-CM

## 2019-11-30 DIAGNOSIS — D1809 Hemangioma of other sites: Secondary | ICD-10-CM | POA: Diagnosis not present

## 2019-11-30 DIAGNOSIS — M7122 Synovial cyst of popliteal space [Baker], left knee: Secondary | ICD-10-CM

## 2019-11-30 DIAGNOSIS — M5136 Other intervertebral disc degeneration, lumbar region: Secondary | ICD-10-CM

## 2019-11-30 DIAGNOSIS — M1712 Unilateral primary osteoarthritis, left knee: Secondary | ICD-10-CM

## 2019-11-30 DIAGNOSIS — M5117 Intervertebral disc disorders with radiculopathy, lumbosacral region: Secondary | ICD-10-CM | POA: Diagnosis not present

## 2019-11-30 DIAGNOSIS — S83242A Other tear of medial meniscus, current injury, left knee, initial encounter: Secondary | ICD-10-CM | POA: Diagnosis not present

## 2019-11-30 DIAGNOSIS — M5116 Intervertebral disc disorders with radiculopathy, lumbar region: Secondary | ICD-10-CM | POA: Diagnosis not present

## 2019-11-30 DIAGNOSIS — M5416 Radiculopathy, lumbar region: Secondary | ICD-10-CM

## 2019-11-30 DIAGNOSIS — M4726 Other spondylosis with radiculopathy, lumbar region: Secondary | ICD-10-CM | POA: Diagnosis not present

## 2019-11-30 IMAGING — MR MR KNEE*L* W/O CM
7 series · 40 of 40 positions shown · non-contrast
Comparison: Plain films left knee [DATE].

CLINICAL DATA: Left knee pain for 3 weeks.  No known injury.

EXAM:
MRI OF THE LEFT KNEE WITHOUT CONTRAST
TECHNIQUE: Multiplanar, multisequence MR imaging of the knee was performed. No
intravenous contrast was administered.

[Series 6: T2 fat-sat · axial · 4.0mm · 0.53mm/px · z∈[-60,+85]mm · 6 of 30 slices shown (1 of 3)]
[im 1/30]
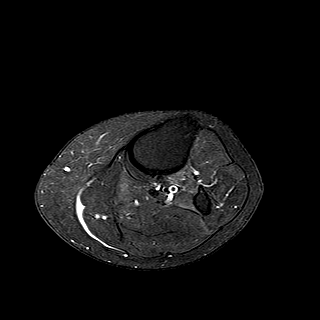
[im 6/30]
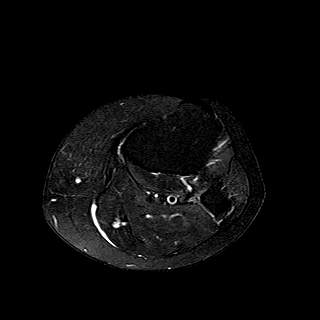
[im 12/30]
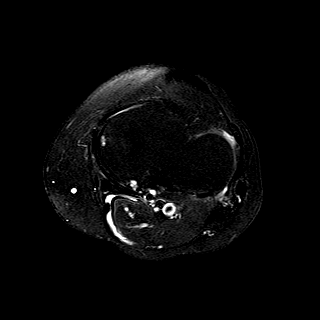
[im 18/30]
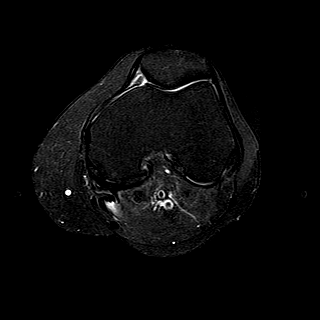
[im 24/30]
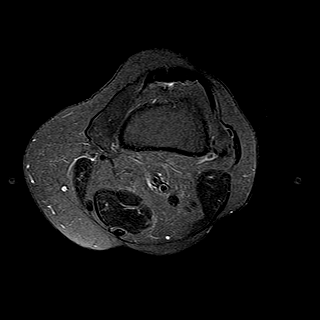
[im 30/30]
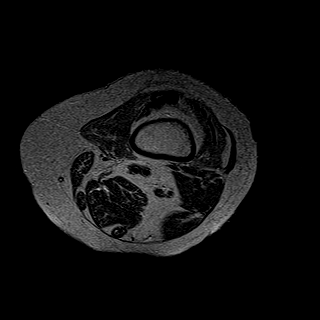

[Series 7: T1 · coronal · 4.0mm · 0.62mm/px · 6 of 27 slices shown]
[im 1/27]
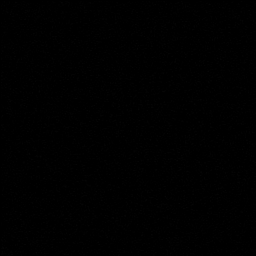
[im 6/27]
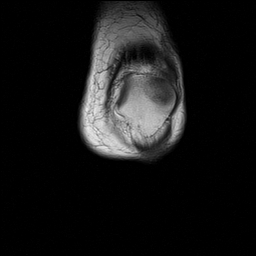
[im 11/27]
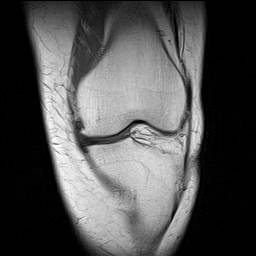
[im 16/27]
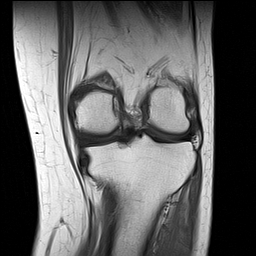
[im 21/27]
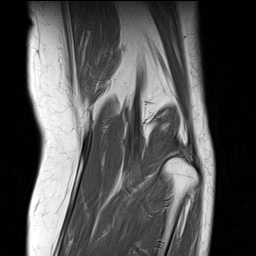
[im 27/27]
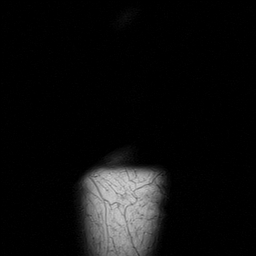

[Series 8: T2 fat-sat · coronal · 4.0mm · 0.62mm/px · 6 of 27 slices shown (2 of 3)]
[im 1/27]
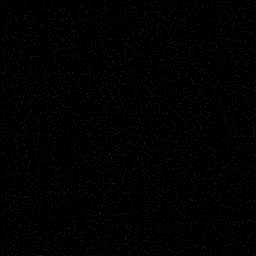
[im 6/27]
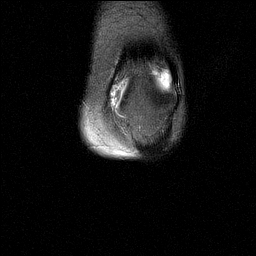
[im 11/27]
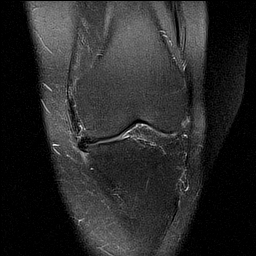
[im 16/27]
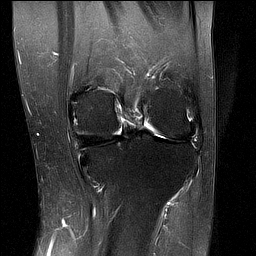
[im 21/27]
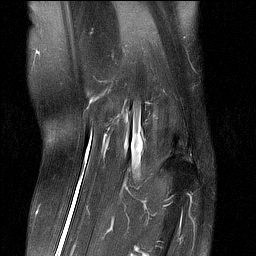
[im 27/27]
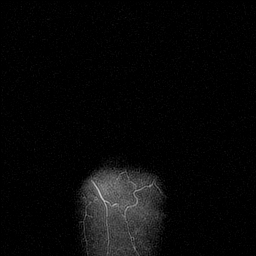

[Series 9: PD fat-sat · sagittal · 3.0mm · 0.62mm/px · 6 of 26 slices shown (1 of 3)]
[im 1/26]
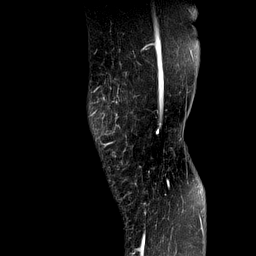
[im 6/26]
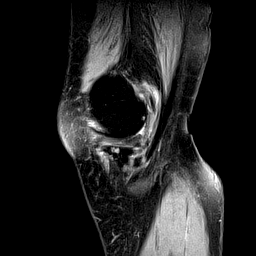
[im 11/26]
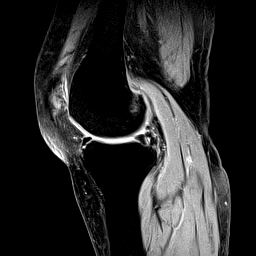
[im 16/26]
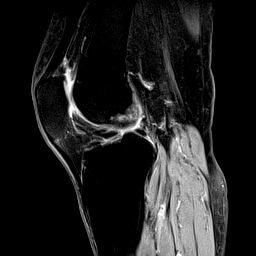
[im 21/26]
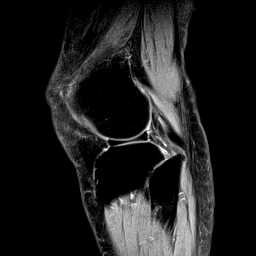
[im 26/26]
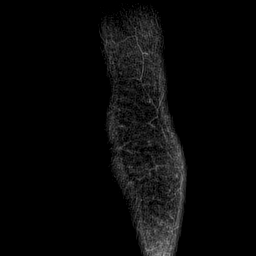

[Series 10: PD fat-sat · coronal · 4.0mm · 0.62mm/px · 6 of 27 slices shown (2 of 3)]
[im 1/27]
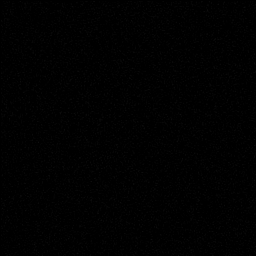
[im 6/27]
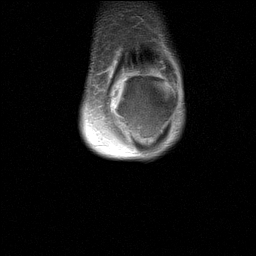
[im 11/27]
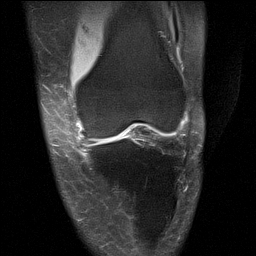
[im 16/27]
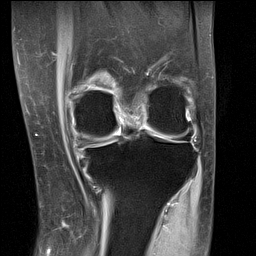
[im 21/27]
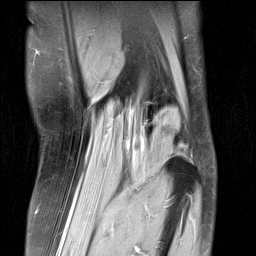
[im 27/27]
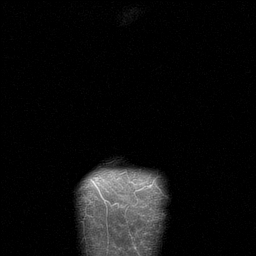

[Series 12: PD fat-sat · coronal · 2.0mm · 0.62mm/px · 4 of 19 slices shown (3 of 3)]
[im 1/19]
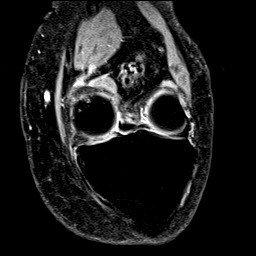
[im 7/19]
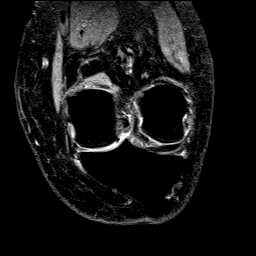
[im 13/19]
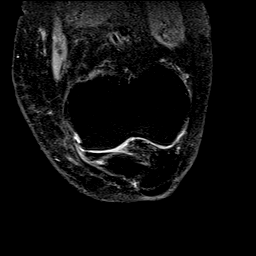
[im 19/19]
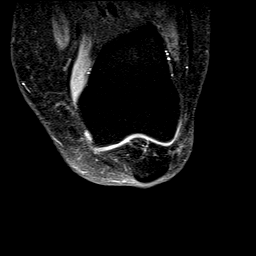

[Series 13: T2 fat-sat · sagittal · 3.0mm · 0.62mm/px · 6 of 26 slices shown (3 of 3)]
[im 1/26]
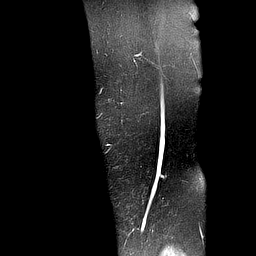
[im 6/26]
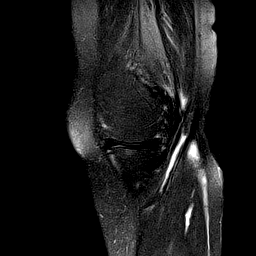
[im 11/26]
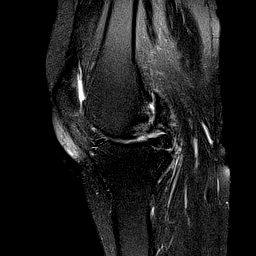
[im 16/26]
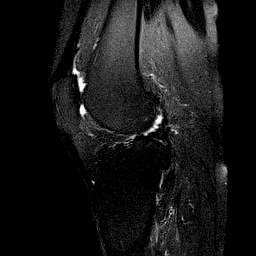
[im 21/26]
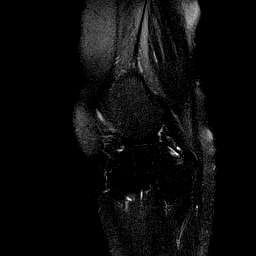
[im 26/26]
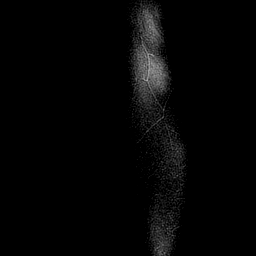

[40 of 40 positions shown; findings below may reference images not displayed]

FINDINGS: MENISCI

Medial meniscus: There is complex tearing throughout the posterior
horn and body.

Lateral meniscus:  Intact.

LIGAMENTS

Cruciates:  Intact.

Collaterals:  Intact.

CARTILAGE

Patellofemoral:  Mildly degenerated.

Medial:  Moderately degenerated.

Lateral:  Mildly degenerated.

Joint: Very small joint effusion. A loose body posterior to the PCL
at its attachment to the tibia measures 1.1 cm in diameter.

Popliteal Fossa: Small Baker's cyst. Fluid has dissected out of the
cyst superficial to the medial gastrocnemius.

Extensor Mechanism:  Intact.

Bones: No fracture, stress change or worrisome lesion. There is some
osteophytosis about the knee, most notable along the medial joint
line.

Other: None.
IMPRESSION: Complex tear posterior horn and body medial meniscus.

Mild-to-moderate osteoarthritis appears worst in the medial
compartment. 1.1 cm loose body posterior to the PCL at its
attachment to the tibia noted.

Small Baker's cyst. Fluid has dissected out of the cyst superficial
to the medial gastrocnemius.

## 2019-11-30 IMAGING — MR MR LUMBAR SPINE W/O CM
4 of 5 series · 26 of 48 positions shown · non-contrast
Comparison: Radiography [DATE]

CLINICAL DATA: Left lumbar radiculopathy for 3 weeks.

EXAM:
MRI LUMBAR SPINE WITHOUT CONTRAST
TECHNIQUE: Multiplanar, multisequence MR imaging of the lumbar spine was
performed. No intravenous contrast was administered.

[Series 4: T2 · sagittal · 4.0mm · 0.81mm/px · 6 of 19 slices shown (1 of 2)]
[im 1/19]
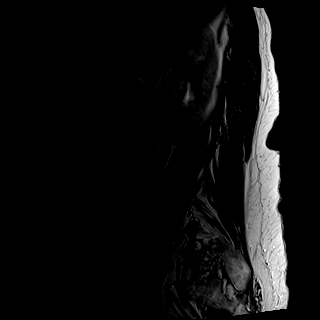
[im 4/19]
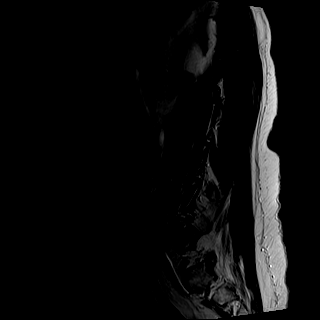
[im 8/19]
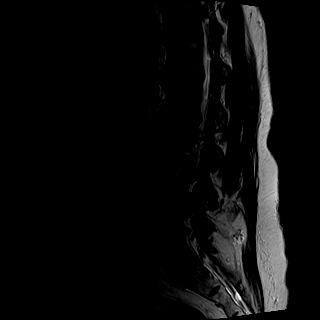
[im 11/19]
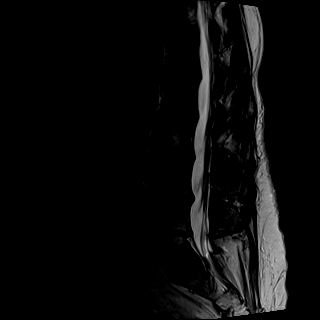
[im 15/19]
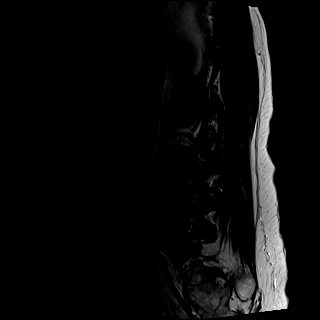
[im 19/19]
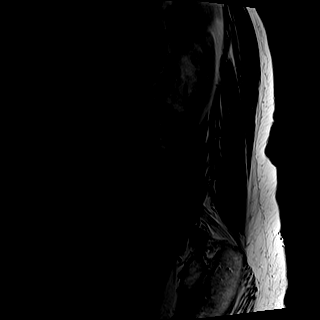

[Series 5: T1 · sagittal · 4.0mm · 0.41mm/px · 6 of 19 slices shown (1 of 2)]
[im 1/19]
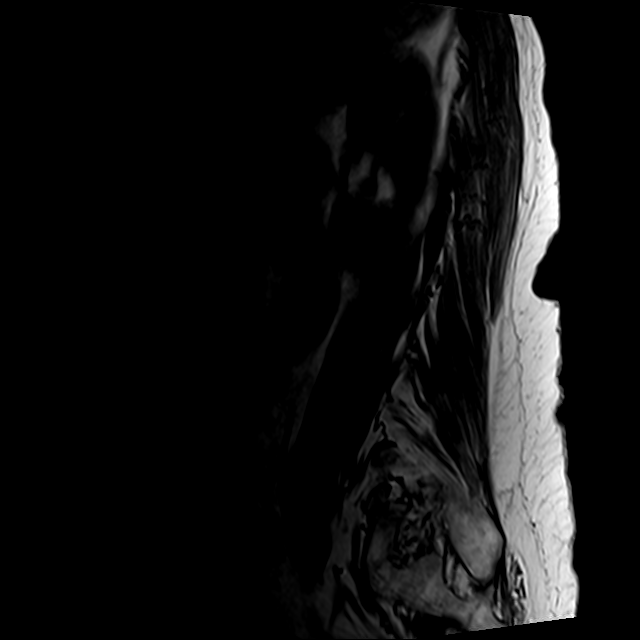
[im 4/19]
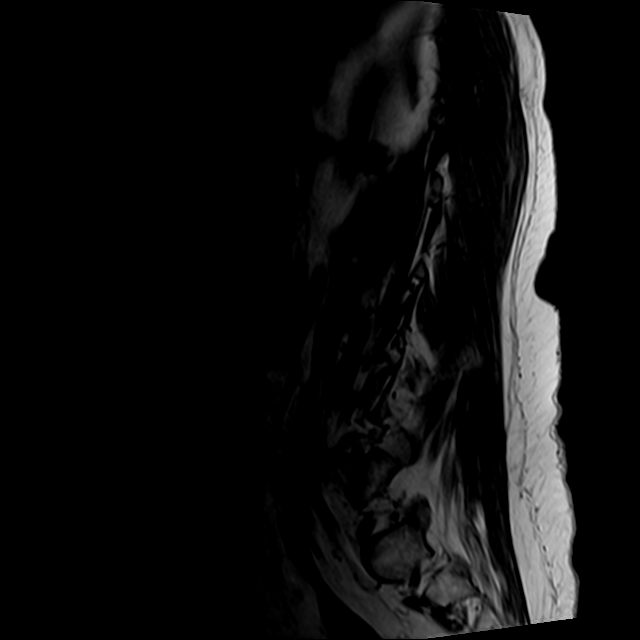
[im 8/19]
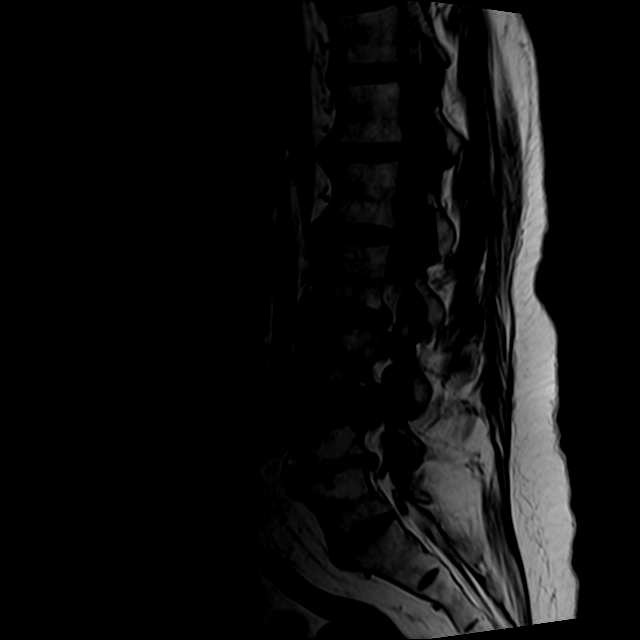
[im 11/19]
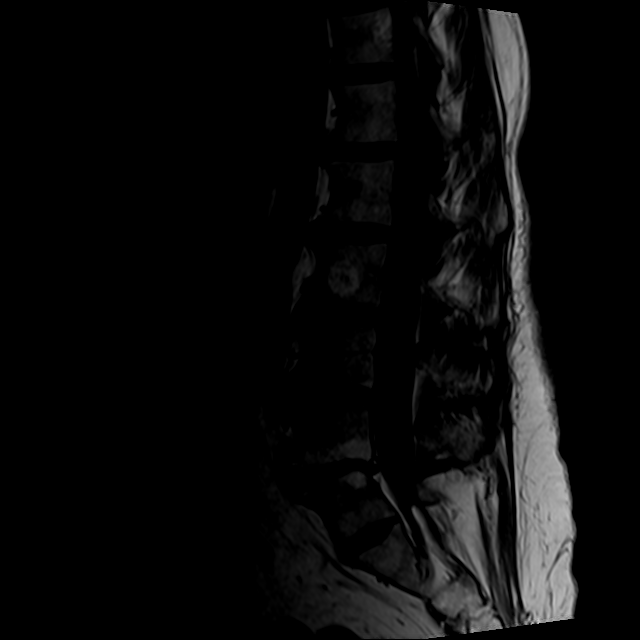
[im 15/19]
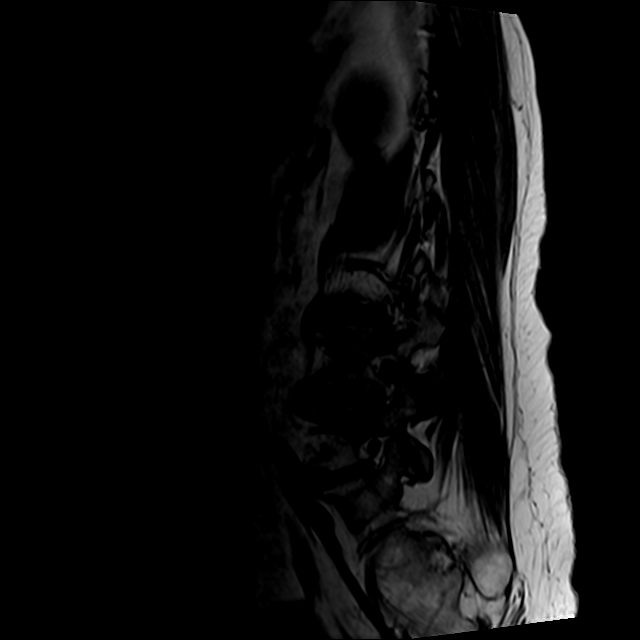
[im 19/19]
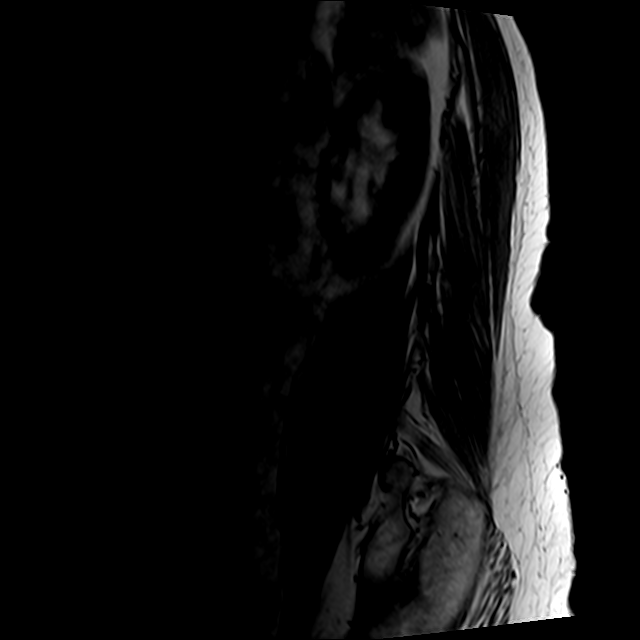

[Series 9: T2 · axial · 4.0mm · 0.78mm/px · z∈[-143,+90]mm · 9 of 44 slices shown (2 of 2)]
[im 1/44]
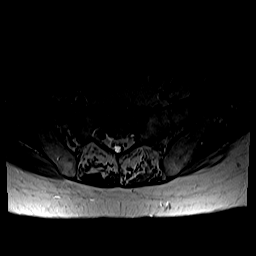
[im 7/44]
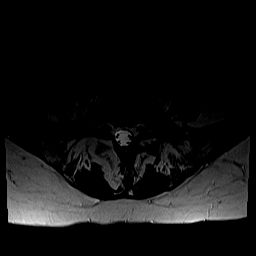
[im 13/44]
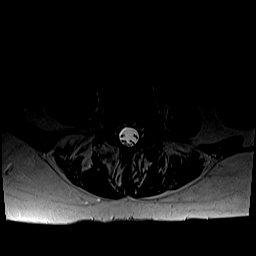
[im 19/44]
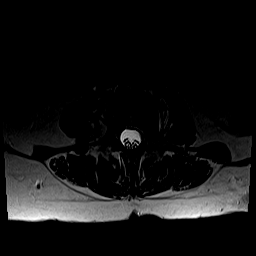
[im 22/44]
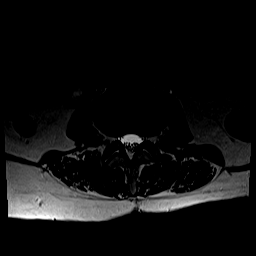
[im 25/44]
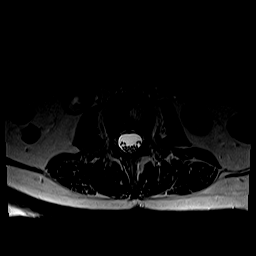
[im 31/44]
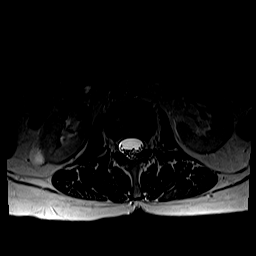
[im 37/44]
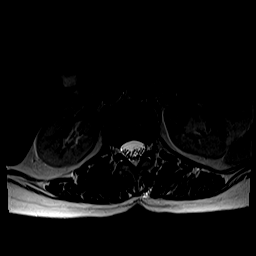
[im 44/44]
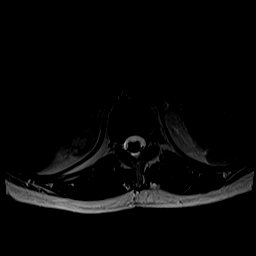

[Series 12: T1 · axial · 4.0mm · 0.39mm/px · z∈[-143,+56]mm · 5 of 44 slices shown (2 of 2)]
[im 1/44]
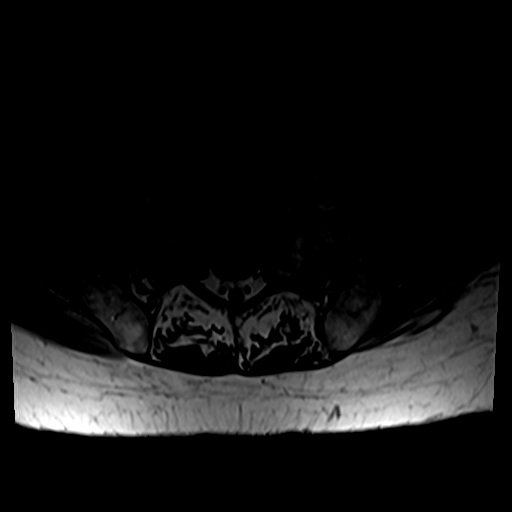
[im 7/44]
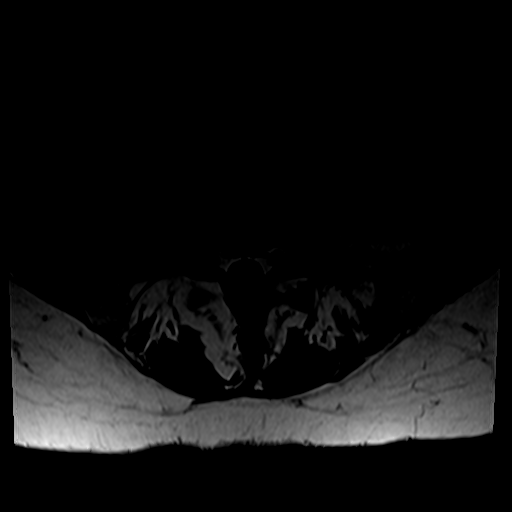
[im 13/44]
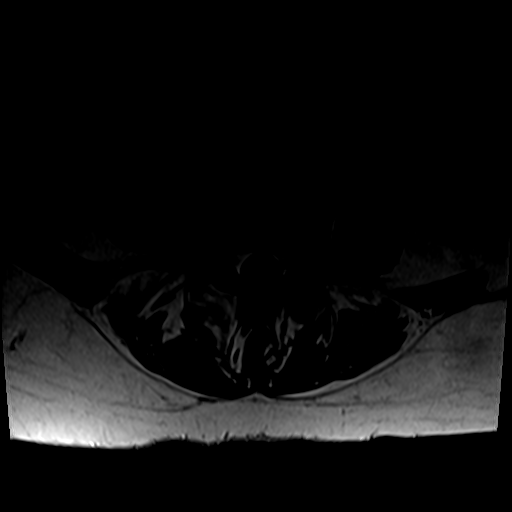
[im 22/44]
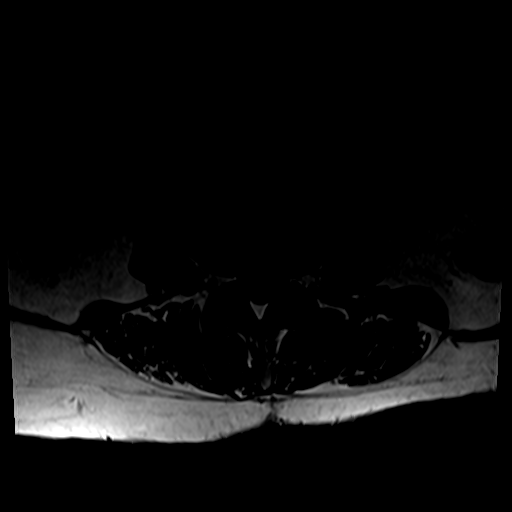
[im 37/44]
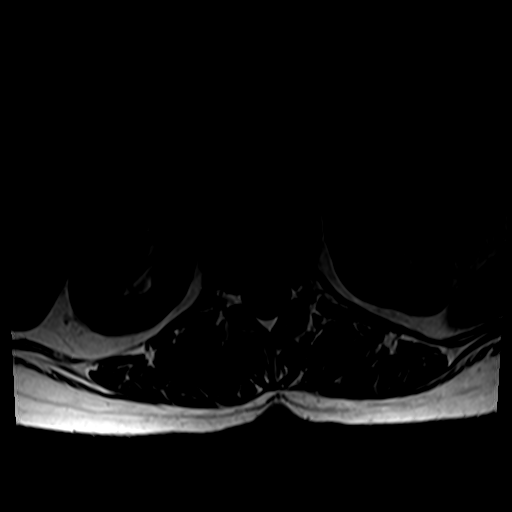

[26 of 48 positions shown; findings below may reference images not displayed]

FINDINGS: Segmentation:  Transitional S1 vertebra based on the lowest ribs.

Alignment:  Slight retrolisthesis at L5-S1

Vertebrae: No fracture, evidence of discitis, or aggressive bone
lesion. T12 hemangioma the left body and pedicle.

Conus medullaris and cauda equina: Conus extends to the L1 level.
Conus and cauda equina appear normal.

Paraspinal and other soft tissues: No visible mass or inflammation
about the spine.

Disc levels:

T12- L1: Unremarkable.

L1-L2: Mild disc narrowing and bulging

L2-L3: Mild disc narrowing and bulging

L3-L4: Mild disc narrowing with bulging.

L4-L5: Disc narrowing and bulging with a left foraminal extrusion
which is superiorly migrating from the disc space and severely
compresses the left foraminal L4 nerve root. Patent canal and right
foramen

L5-S1:Disc collapse and endplate degeneration with ridging and disc
bulging. Negative facets. No neural compression.
IMPRESSION: 1. L4-5 left foraminal extrusion with prominent L4 compression.
2. Degenerative changes at the other levels are noncompressive and
described above.

## 2019-12-02 ENCOUNTER — Other Ambulatory Visit: Payer: Self-pay | Admitting: Sports Medicine

## 2019-12-02 DIAGNOSIS — M1712 Unilateral primary osteoarthritis, left knee: Secondary | ICD-10-CM

## 2019-12-05 ENCOUNTER — Ambulatory Visit: Payer: Medicare Other | Admitting: Sports Medicine

## 2019-12-09 ENCOUNTER — Other Ambulatory Visit: Payer: Self-pay | Admitting: Nurse Practitioner

## 2019-12-09 DIAGNOSIS — G4762 Sleep related leg cramps: Secondary | ICD-10-CM

## 2019-12-11 DIAGNOSIS — M545 Low back pain, unspecified: Secondary | ICD-10-CM | POA: Diagnosis not present

## 2019-12-11 DIAGNOSIS — M5442 Lumbago with sciatica, left side: Secondary | ICD-10-CM | POA: Diagnosis not present

## 2019-12-11 DIAGNOSIS — M25562 Pain in left knee: Secondary | ICD-10-CM | POA: Diagnosis not present

## 2019-12-17 ENCOUNTER — Encounter (HOSPITAL_BASED_OUTPATIENT_CLINIC_OR_DEPARTMENT_OTHER): Payer: Self-pay | Admitting: Orthopedic Surgery

## 2019-12-17 ENCOUNTER — Other Ambulatory Visit: Payer: Self-pay | Admitting: Orthopedic Surgery

## 2019-12-17 ENCOUNTER — Other Ambulatory Visit: Payer: Self-pay

## 2019-12-18 ENCOUNTER — Encounter (HOSPITAL_COMMUNITY): Payer: Self-pay | Admitting: Anesthesiology

## 2019-12-18 ENCOUNTER — Other Ambulatory Visit (HOSPITAL_COMMUNITY)
Admission: RE | Admit: 2019-12-18 | Discharge: 2019-12-18 | Disposition: A | Discharge status: Home / Self Care | Payer: Medicare Other | Source: Ambulatory Visit | Attending: Orthopedic Surgery | Admitting: Orthopedic Surgery

## 2019-12-18 ENCOUNTER — Encounter (HOSPITAL_BASED_OUTPATIENT_CLINIC_OR_DEPARTMENT_OTHER)
Admission: RE | Admit: 2019-12-18 | Discharge: 2019-12-18 | Disposition: A | Discharge status: Home / Self Care | Payer: Medicare Other | Source: Ambulatory Visit | Attending: Orthopedic Surgery | Admitting: Orthopedic Surgery

## 2019-12-18 DIAGNOSIS — S83242A Other tear of medial meniscus, current injury, left knee, initial encounter: Secondary | ICD-10-CM | POA: Diagnosis not present

## 2019-12-18 DIAGNOSIS — Z20822 Contact with and (suspected) exposure to covid-19: Secondary | ICD-10-CM | POA: Insufficient documentation

## 2019-12-18 DIAGNOSIS — Z01812 Encounter for preprocedural laboratory examination: Secondary | ICD-10-CM | POA: Insufficient documentation

## 2019-12-18 DIAGNOSIS — Z538 Procedure and treatment not carried out for other reasons: Secondary | ICD-10-CM | POA: Diagnosis not present

## 2019-12-18 DIAGNOSIS — X58XXXA Exposure to other specified factors, initial encounter: Secondary | ICD-10-CM | POA: Diagnosis not present

## 2019-12-18 LAB — BASIC METABOLIC PANEL
Anion gap: 15 (ref 5–15)
BUN: 15 mg/dL (ref 8–23)
CO2: 25 mmol/L (ref 22–32)
Calcium: 9.9 mg/dL (ref 8.9–10.3)
Chloride: 97 mmol/L — ABNORMAL LOW (ref 98–111)
Creatinine, Ser: 0.8 mg/dL (ref 0.44–1.00)
GFR, Estimated: >60 mL/min (ref >60)
Glucose, Bld: 100 mg/dL — ABNORMAL HIGH (ref 70–99)
Potassium: 3.8 mmol/L (ref 3.5–5.1)
Sodium: 137 mmol/L (ref 135–145)

## 2019-12-18 LAB — SARS CORONAVIRUS 2 (TAT 6-24 HRS): SARS Coronavirus 2: NEGATIVE

## 2019-12-18 NOTE — Anesthesia Preprocedure Evaluation (Deleted)
Anesthesia Evaluation  Patient identified by MRN, date of birth, ID band Patient awake    Reviewed: Allergy & Precautions, H&P , NPO status , Patient's Chart, lab work & pertinent test results  Airway        Dental no notable dental hx.    Pulmonary neg pulmonary ROS,    Pulmonary exam normal        Cardiovascular hypertension, Pt. on medications      Neuro/Psych negative neurological ROS  negative psych ROS   GI/Hepatic Neg liver ROS, GERD  ,  Endo/Other  negative endocrine ROS  Renal/GU negative Renal ROS  negative genitourinary   Musculoskeletal  (+) Arthritis ,   Abdominal   Peds  Hematology negative hematology ROS (+)   Anesthesia Other Findings   Reproductive/Obstetrics negative OB ROS                             Anesthesia Physical Anesthesia Plan  ASA: II  Anesthesia Plan: General   Post-op Pain Management:  Regional for Post-op pain   Induction: Intravenous  PONV Risk Score and Plan: 4 or greater and Ondansetron, Dexamethasone and Midazolam  Airway Management Planned: LMA  Additional Equipment:   Intra-op Plan:   Post-operative Plan: Extubation in OR  Informed Consent: I have reviewed the patients History and Physical, chart, labs and discussed the procedure including the risks, benefits and alternatives for the proposed anesthesia with the patient or authorized representative who has indicated his/her understanding and acceptance.       Plan Discussed with: CRNA and Surgeon  Anesthesia Plan Comments:         Anesthesia Quick Evaluation

## 2019-12-19 ENCOUNTER — Ambulatory Visit (HOSPITAL_BASED_OUTPATIENT_CLINIC_OR_DEPARTMENT_OTHER)
Admission: RE | Admit: 2019-12-19 | Discharge: 2019-12-19 | Disposition: A | Discharge status: Home / Self Care | Payer: Medicare Other | Attending: Orthopedic Surgery | Admitting: Orthopedic Surgery

## 2019-12-19 ENCOUNTER — Encounter (HOSPITAL_BASED_OUTPATIENT_CLINIC_OR_DEPARTMENT_OTHER)
Admission: RE | Disposition: A | Discharge status: Home / Self Care | Payer: Self-pay | Source: Home / Self Care | Attending: Orthopedic Surgery

## 2019-12-19 DIAGNOSIS — X58XXXA Exposure to other specified factors, initial encounter: Secondary | ICD-10-CM | POA: Insufficient documentation

## 2019-12-19 DIAGNOSIS — Z538 Procedure and treatment not carried out for other reasons: Secondary | ICD-10-CM | POA: Diagnosis not present

## 2019-12-19 DIAGNOSIS — S83242A Other tear of medial meniscus, current injury, left knee, initial encounter: Secondary | ICD-10-CM | POA: Insufficient documentation

## 2019-12-19 HISTORY — DX: Essential (primary) hypertension: I10

## 2019-12-19 HISTORY — DX: Other tear of medial meniscus, current injury, unspecified knee, initial encounter: S83.249A

## 2019-12-19 HISTORY — DX: Unspecified osteoarthritis, unspecified site: M19.90

## 2019-12-19 HISTORY — DX: Hyperlipidemia, unspecified: E78.5

## 2019-12-19 SURGERY — ARTHROSCOPY, KNEE
Anesthesia: Choice | Site: Knee | Laterality: Left

## 2019-12-19 MED ORDER — CEFAZOLIN SODIUM-DEXTROSE 2-4 GM/100ML-% IV SOLN: 2.0000 g | INTRAVENOUS | Status: DC

## 2019-12-19 MED ORDER — ACETAMINOPHEN 500 MG PO TABS: 1000.0000 mg | ORAL_TABLET | Freq: Once | ORAL | Status: DC

## 2019-12-19 MED ORDER — CELECOXIB 200 MG PO CAPS: 200.0000 mg | ORAL_CAPSULE | Freq: Once | ORAL | Status: DC

## 2019-12-19 MED ORDER — LACTATED RINGERS IV SOLN: INTRAVENOUS | Status: DC

## 2019-12-19 NOTE — Progress Notes (Signed)
Call from [facility] office who states insurance has no been approved, please cancel case for today. Pt notified, verbalized understanding.

## 2019-12-29 ENCOUNTER — Other Ambulatory Visit: Payer: Self-pay | Admitting: Nurse Practitioner

## 2019-12-29 DIAGNOSIS — E782 Mixed hyperlipidemia: Secondary | ICD-10-CM

## 2019-12-31 ENCOUNTER — Telehealth: Payer: Self-pay | Admitting: Sports Medicine

## 2019-12-31 NOTE — Telephone Encounter (Signed)
Dr. Luiz Blare you mean?  I think this is a discussion she needs to have with her surgeon.  The reason we referred her is because I had nothing left to offer.

## 2019-12-31 NOTE — Telephone Encounter (Signed)
Dr. Kathryne Eriksson called and stated she was scheduled with Dr. Tasia Catchings at [facility] for her surgery but it was cancelled due to her insurance denied the claim stating it wasn't an emergency. After speaking to her insurance they stated they would need notes from you and Dr. Tasia Catchings explaining why it is an emergency. She wanted to know what she should do now. Please advise.   Arline Asp

## 2020-01-01 NOTE — Telephone Encounter (Signed)
I called patient to let her know the surgeon whom she states is Dr. Tasia Catchings would be the one to send notes. Patient stated they called today and rescheduled her surgery for 1/17 at 2:00 so for now everything is straightened out. - CF

## 2020-01-12 ENCOUNTER — Other Ambulatory Visit: Payer: Self-pay

## 2020-01-12 ENCOUNTER — Encounter (HOSPITAL_BASED_OUTPATIENT_CLINIC_OR_DEPARTMENT_OTHER): Payer: Self-pay | Admitting: Orthopedic Surgery

## 2020-01-15 ENCOUNTER — Other Ambulatory Visit (HOSPITAL_COMMUNITY): Payer: Medicare Other

## 2020-01-19 ENCOUNTER — Ambulatory Visit (HOSPITAL_BASED_OUTPATIENT_CLINIC_OR_DEPARTMENT_OTHER): Admission: RE | Admit: 2020-01-19 | Payer: Medicare Other | Source: Home / Self Care | Admitting: Orthopedic Surgery

## 2020-01-19 SURGERY — ARTHROSCOPY, KNEE
Anesthesia: Choice | Site: Knee | Laterality: Left

## 2020-02-20 ENCOUNTER — Ambulatory Visit (INDEPENDENT_AMBULATORY_CARE_PROVIDER_SITE_OTHER): Payer: Medicare Other | Admitting: Osteopathic Medicine

## 2020-02-20 DIAGNOSIS — Z0000 Encounter for general adult medical examination without abnormal findings: Secondary | ICD-10-CM | POA: Diagnosis not present

## 2020-02-20 NOTE — Progress Notes (Signed)
MEDICARE ANNUAL WELLNESS VISIT  02/20/2020  Telephone Visit Disclaimer This Medicare AWV was conducted by telephone due to national recommendations for restrictions regarding the COVID-19 Pandemic (e.g. social distancing).  I verified, using two identifiers, that I am speaking with Jill Bernard or their authorized healthcare agent. I discussed the limitations, risks, security, and privacy concerns of performing an evaluation and management service by telephone and the potential availability of an in-person appointment in the future. The patient expressed understanding and agreed to proceed.  Location of Patient: Home Location of Provider (nurse):  In the office.  Subjective:    Jill Bernard is a 75 y.o. female patient of Emeterio Reeve, DO who had a Medicare Annual Wellness Visit today via telephone. Jill Bernard is Retired and lives with their son and his girlfriend. she has 3 children. she reports that she is socially active and does interact with friends/family regularly. she is minimally physically active and enjoys going bowling, fishing and reading.  Patient Care Team: Emeterio Reeve, DO as PCP - General (Osteopathic Medicine)  Advanced Directives 02/20/2020 01/12/2020 12/17/2019  Does Patient Have a Medical Advance Directive? No No No  Would patient like information on creating a medical advance directive? No - Patient declined No - Patient declined No - Patient declined    Hospital Utilization Over the Past 12 Months: # of hospitalizations or ER visits: 0 # of surgeries: 0  Review of Systems    Patient reports that her overall health is unchanged compared to last year.  History obtained from chart review and the patient  Patient Reported Readings (BP, Pulse, CBG, Weight, etc) none  Pain Assessment Pain : No/denies pain     Current Medications & Allergies (verified) Allergies as of 02/20/2020      Reactions   Latex Other (See Comments)   Unknown    Lisinopril Cough      Medication List       Accurate as of February 20, 2020 10:09 AM. If you have any questions, ask your nurse or doctor.        AMBULATORY NON FORMULARY MEDICATION Blood pressure monitor.   amLODipine 10 MG tablet Commonly known as: NORVASC TAKE 1 TABLET(10 MG) BY MOUTH DAILY   chlorthalidone 25 MG tablet Commonly known as: HYGROTON TAKE 1 TABLET(25 MG) BY MOUTH DAILY   diclofenac Sodium 1 % Gel Commonly known as: VOLTAREN Apply 1 application topically 4 (four) times daily as needed.   gabapentin 300 MG capsule Commonly known as: NEURONTIN One tab PO qHS for a week, then BID for a week, then TID. May double weekly to a max of 3,600mg /day   multivitamin with minerals Tabs tablet Take 1 tablet by mouth daily.   rosuvastatin 20 MG tablet Commonly known as: CRESTOR TAKE 1/2 TABLET BY MOUTH AT BEDTIME FOR 8 DAYS, THEN INCREASE TO 1 TABLET AT BEDTIME       History (reviewed): Past Medical History:  Diagnosis Date  . Arthritis    back  . Hyperlipidemia   . Hypertension   . MMT (medial meniscus tear)    left   Past Surgical History:  Procedure Laterality Date  . NO PAST SURGERIES     Family History  Problem Relation Age of Onset  . Hypertension Father   . Diabetes Maternal Grandmother   . Diabetes Maternal Grandfather    Social History   Socioeconomic History  . Marital status: Single    Spouse name: Not on file  . Number of children: 3  .  Years of education: 9th  . Highest education level: 9th grade  Occupational History  . Occupation: Retired    Comment: Bakery  Tobacco Use  . Smoking status: Never Smoker  . Smokeless tobacco: Never Used  Substance and Sexual Activity  . Alcohol use: Never  . Drug use: Never  . Sexual activity: Not Currently    Birth control/protection: Post-menopausal  Other Topics Concern  . Not on file  Social History Narrative   Lives with her son and son's girlfriend. Likes to bowl, go fishing and read  in her free time.   Social Determinants of Health   Financial Resource Strain: Low Risk   . Difficulty of Paying Living Expenses: Not hard at all  Food Insecurity: No Food Insecurity  . Worried About Charity fundraiser in the Last Year: Never true  . Ran Out of Food in the Last Year: Never true  Transportation Needs: No Transportation Needs  . Lack of Transportation (Medical): No  . Lack of Transportation (Non-Medical): No  Physical Activity: Inactive  . Days of Exercise per Week: 0 days  . Minutes of Exercise per Session: 0 min  Stress: No Stress Concern Present  . Feeling of Stress : Only a little  Social Connections: Socially Isolated  . Frequency of Communication with Friends and Family: More than three times a week  . Frequency of Social Gatherings with Friends and Family: Once a week  . Attends Religious Services: Never  . Active Member of Clubs or Organizations: No  . Attends Archivist Meetings: Never  . Marital Status: Separated    Activities of Daily Living In your present state of health, do you have any difficulty performing the following activities: 02/20/2020  Hearing? N  Vision? N  Difficulty concentrating or making decisions? N  Walking or climbing stairs? Y  Comment sometimes; due to knee  Dressing or bathing? N  Doing errands, shopping? N  Preparing Food and eating ? N  Using the Toilet? N  In the past six months, have you accidently leaked urine? N  Do you have problems with loss of bowel control? N  Managing your Medications? N  Managing your Finances? N  Housekeeping or managing your Housekeeping? N  Some recent data might be hidden    Patient Education/ Literacy How often do you need to have someone help you when you read instructions, pamphlets, or other written materials from your doctor or pharmacy?: 1 - Never What is the last grade level you completed in school?: 9th Grade  Exercise Current Exercise Habits: The patient does not  participate in regular exercise at present, Exercise limited by: orthopedic condition(s)  Diet Patient reports consuming 2 meals a day and 1 snack(s) a day Patient reports that her primary diet is: Regular Patient reports that she does have regular access to food.   Depression Screen PHQ 2/9 Scores 02/20/2020 10/24/2016  PHQ - 2 Score 0 0     Fall Risk Fall Risk  02/20/2020 10/24/2016  Falls in the past year? 0 No  Number falls in past yr: 0 -  Injury with Fall? 0 -  Risk for fall due to : No Fall Risks -  Follow up Falls evaluation completed;Education provided -     Objective:  Jill Bernard seemed alert and oriented and she participated appropriately during our telephone visit.  Blood Pressure Weight BMI  BP Readings from Last 3 Encounters:  11/07/19 (!) 172/78  08/26/19 (!) 196/89  01/14/19 Marland Kitchen)  141/75   Wt Readings from Last 3 Encounters:  12/17/19 137 lb (62.1 kg)  11/07/19 137 lb 11.2 oz (62.5 kg)  08/26/19 140 lb 4.8 oz (63.6 kg)   BMI Readings from Last 1 Encounters:  12/17/19 25.06 kg/m    *Unable to obtain current vital signs, weight, and BMI due to telephone visit type  Hearing/Vision  . Jill Bernard did not seem to have difficulty with hearing/understanding during the telephone conversation . Reports that she has not had a formal eye exam by an eye care professional within the past year . Reports that she has not had a formal hearing evaluation within the past year *Unable to fully assess hearing and vision during telephone visit type  Cognitive Function: 6CIT Screen 02/20/2020  What Year? 0 points  What month? 0 points  What time? 0 points  Count back from 20 0 points  Months in reverse 0 points  Repeat phrase 0 points  Total Score 0   (Normal:0-7, Significant for Dysfunction: >8)  Normal Cognitive Function Screening: Yes   Immunization & Health Maintenance Record Immunization History  Administered Date(s) Administered  . Influenza,inj,Quad PF,6+  Mos 09/30/2012  . Influenza-Unspecified 09/30/2012  . Pneumococcal Polysaccharide-23 09/30/2012  . Tdap 08/26/2019    Health Maintenance  Topic Date Due  . COVID-19 Vaccine (1) 03/07/2020 (Originally 04/25/1950)  . INFLUENZA VACCINE  04/01/2020 (Originally 08/03/2019)  . PNA vac Low Risk Adult (2 of 2 - PCV13) 08/25/2020 (Originally 09/30/2013)  . MAMMOGRAM  11/06/2020 (Originally 04/25/1997)  . DEXA SCAN  11/06/2020 (Originally 04/25/2012)  . COLONOSCOPY (Pts 45-32yrs Insurance coverage will need to be confirmed)  11/06/2020 (Originally 04/25/1992)  . Hepatitis C Screening  11/06/2020 (Originally 28-Aug-1945)  . TETANUS/TDAP  08/25/2029       Assessment  This is a routine wellness examination for Jill Bernard.  Health Maintenance: Due or Overdue There are no preventive care reminders to display for this patient.  Jill Bernard does not need a referral for Community Assistance: Care Management:   no Social Work:    no Prescription Assistance:  no Nutrition/Diabetes Education:  no   Plan:  Personalized Goals Goals Addressed              This Visit's Progress   .  Patient Stated (pt-stated)        02/20/2020 AWV Goal: Exercise for General Health   Patient will verbalize understanding of the benefits of increased physical activity:  Exercising regularly is important. It will improve your overall fitness, flexibility, and endurance.  Regular exercise also will improve your overall health. It can help you control your weight, reduce stress, and improve your bone density.  Over the next year, patient will increase physical activity as tolerated with a goal of at least 150 minutes of moderate physical activity per week.   You can tell that you are exercising at a moderate intensity if your heart starts beating faster and you start breathing faster but can still hold a conversation.  Moderate-intensity exercise ideas include:  Walking 1 mile (1.6 km) in about 15  minutes  Biking  Hiking  Golfing  Dancing  Water aerobics  Patient will verbalize understanding of everyday activities that increase physical activity by providing examples like the following: ? Yard work, such as: ? Pushing a Conservation officer, nature ? Raking and bagging leaves ? Washing your car ? Pushing a stroller ? Shoveling snow ? Gardening ? Washing windows or floors  Patient will be able to explain general safety guidelines  for exercising:   Before you start a new exercise program, talk with your health care provider.  Do not exercise so much that you hurt yourself, feel dizzy, or get very short of breath.  Wear comfortable clothes and wear shoes with good support.  Drink plenty of water while you exercise to prevent dehydration or heat stroke.  Work out until your breathing and your heartbeat get faster.       Personalized Health Maintenance & Screening Recommendations  Pneumococcal vaccine  Influenza vaccine Screening mammography Bone densitometry screening Colorectal cancer screening  Eye exam Covid vaccine Shingrix vaccine  Lung Cancer Screening Recommended: no (Low Dose CT Chest recommended if Age 22-80 years, 30 pack-year currently smoking OR have quit w/in past 15 years) Hepatitis C Screening recommended: yes HIV Screening recommended: yes  Advanced Directives: Written information was not prepared per patient's request.  Referrals & Orders No orders of the defined types were placed in this encounter.   Follow-up Plan . Follow-up with Emeterio Reeve, DO as planned  I have personally reviewed and noted the following in the patient's chart:   . Medical and social history . Use of alcohol, tobacco or illicit drugs  . Current medications and supplements . Functional ability and status . Nutritional status . Physical activity . Advanced directives . List of other physicians . Hospitalizations, surgeries, and ER visits in previous 12  months . Vitals . Screenings to include cognitive, depression, and falls . Referrals and appointments  In addition, I have reviewed and discussed with Jill Bernard certain preventive protocols, quality metrics, and best practice recommendations. A written personalized care plan for preventive services as well as general preventive health recommendations is available and can be mailed to the patient at her request.      Jill Gens, RN  02/20/2020

## 2020-02-20 NOTE — Patient Instructions (Addendum)
Bone Density Test A bone density test uses a type of X-ray to measure the amount of calcium and other minerals in a person's bones. It can measure bone density in the hip and the spine. The test is similar to having a regular X-ray. This test may also be called:  Bone densitometry.  Bone mineral density test.  Dual-energy X-ray absorptiometry (DEXA). You may have this test to:  Diagnose a condition that causes weak or thin bones (osteoporosis).  Screen you for osteoporosis.  Predict your risk for a broken bone (fracture).  Determine how well your osteoporosis treatment is working. Tell a health care provider about:  Any allergies you have.  All medicines you are taking, including vitamins, herbs, eye drops, creams, and over-the-counter medicines.  Any problems you or family members have had with anesthetic medicines.  Any blood disorders you have.  Any surgeries you have had.  Any medical conditions you have.  Whether you are pregnant or may be pregnant.  Any medical tests you have had within the past 14 days that used contrast material. What are the risks? Generally, this is a safe test. However, it does expose you to a small amount of radiation, which can slightly increase your cancer risk. What happens before the test?  Do not take any calcium supplements within the 24 hours before your test.  You will need to remove all metal jewelry, eyeglasses, removable dental appliances, and any other metal objects on your body. What happens during the test?  You will lie down on an exam table. There will be an X-ray generator below you and an imaging device above you.  Other devices, such as boxes or braces, may be used to position your body properly for the scan.  The machine will slowly scan your body. You will need to keep very still while the machine does the scan.  The images will show up on a screen in the room. Images will be examined by a specialist after your  test is finished. The procedure may vary among health care providers and hospitals.   What can I expect after the test? It is up to you to get the results of your test. Ask your health care provider, or the department that is doing the test, when your results will be ready. Summary  A bone density test is an imaging test that uses a type of X-ray to measure the amount of calcium and other minerals in your bones.  The test may be used to diagnose or screen you for a condition that causes weak or thin bones (osteoporosis), predict your risk for a broken bone (fracture), or determine how well your osteoporosis treatment is working.  Do not take any calcium supplements within 24 hours before your test.  Ask your health care provider, or the department that is doing the test, when your results will be ready. This information is not intended to replace advice given to you by your health care provider. Make sure you discuss any questions you have with your health care provider. Document Revised: 06/05/2019 Document Reviewed: 06/05/2019 Elsevier Patient Education  2021 [employer].   Colonoscopy, Adult A colonoscopy is a procedure to look at the entire large intestine. This procedure is done using a long, thin, flexible tube that has a camera on the end. You may have a colonoscopy:  As a part of normal colorectal screening.  If you have certain symptoms, such as: ? A low number of red blood cells  in your blood (anemia). ? Diarrhea that does not go away. ? Pain in your abdomen. ? Blood in your stool. A colonoscopy can help screen for and diagnose medical problems, including:  Tumors.  Extra tissue that grows where mucus forms (polyps).  Inflammation.  Areas of bleeding. Tell your health care provider about:  Any allergies you have.  All medicines you are taking, including vitamins, herbs, eye drops, creams, and over-the-counter medicines.  Any problems you or family members have  had with anesthetic medicines.  Any blood disorders you have.  Any surgeries you have had.  Any medical conditions you have.  Any problems you have had with having bowel movements.  Whether you are pregnant or may be pregnant. What are the risks? Generally, this is a safe procedure. However, problems may occur, including:  Bleeding.  Damage to your intestine.  Allergic reactions to medicines given during the procedure.  Infection. This is rare. What happens before the procedure? Eating and drinking restrictions Follow instructions from your health care provider about eating or drinking restrictions, which may include:  A few days before the procedure: ? Follow a low-fiber diet. ? Avoid nuts, seeds, dried fruit, raw fruits, and vegetables.  1-3 days before the procedure: ? Eat only gelatin dessert or ice pops. ? Drink only clear liquids, such as water, clear juice, clear broth or bouillon, black coffee or tea, or clear soft drinks or sports drinks. ? Avoid liquids that contain red or purple dye.  The day of the procedure: ? Do not eat solid foods. You may continue to drink clear liquids until up to 2 hours before the procedure. ? Do not eat or drink anything starting 2 hours before the procedure, or within the time period that your health care provider recommends. Bowel prep If you were prescribed a bowel prep to take by mouth (orally) to clean out your colon:  Take it as told by your health care provider. Starting the day before your procedure, you will need to drink a large amount of liquid medicine. The liquid will cause you to have many bowel movements of loose stool until your stool becomes almost clear or light green.  If your skin or the opening between the buttocks (anus) gets irritated from diarrhea, you may relieve the irritation using: ? Wipes with medicine in them, such as adult wet wipes with aloe and vitamin E. ? A product to soothe skin, such as petroleum  jelly.  If you vomit while drinking the bowel prep: ? Take a break for up to 60 minutes. ? Begin the bowel prep again. ? Call your health care provider if you keep vomiting or you cannot take the bowel prep without vomiting.  To clean out your colon, you may also be given: ? Laxative medicines. These help you have a bowel movement. ? Instructions for enema use. An enema is liquid medicine injected into your rectum. Medicines Ask your health care provider about:  Changing or stopping your regular medicines or supplements. This is especially important if you are taking iron supplements, diabetes medicines, or blood thinners.  Taking medicines such as aspirin and ibuprofen. These medicines can thin your blood. Do not take these medicines unless your health care provider tells you to take them.  Taking over-the-counter medicines, vitamins, herbs, and supplements. General instructions  Ask your health care provider what steps will be taken to help prevent infection. These may include washing skin with a germ-killing soap.  Plan to have someone take  you home from the hospital or clinic. What happens during the procedure?  An IV will be inserted into one of your veins.  You may be given one or more of the following: ? A medicine to help you relax (sedative). ? A medicine to numb the area (local anesthetic). ? A medicine to make you fall asleep (general anesthetic). This is rarely needed.  You will lie on your side with your knees bent.  The tube will: ? Have oil or gel put on it (be lubricated). ? Be inserted into your anus. ? Be gently eased through all parts of your large intestine.  Air will be sent into your colon to keep it open. This may cause some pressure or cramping.  Images will be taken with the camera and will appear on a screen.  A small tissue sample may be removed to be looked at under a microscope (biopsy). The tissue may be sent to a lab for testing if any signs  of problems are found.  If small polyps are found, they may be removed and checked for cancer cells.  When the procedure is finished, the tube will be removed. The procedure may vary among health care providers and hospitals.   What happens after the procedure?  Your blood pressure, heart rate, breathing rate, and blood oxygen level will be monitored until you leave the hospital or clinic.  You may have a small amount of blood in your stool.  You may pass gas and have mild cramping or bloating in your abdomen. This is caused by the air that was used to open your colon during the exam.  Do not drive for 24 hours after the procedure.  It is up to you to get the results of your procedure. Ask your health care provider, or the department that is doing the procedure, when your results will be ready. Summary  A colonoscopy is a procedure to look at the entire large intestine.  Follow instructions from your health care provider about eating and drinking before the procedure.  If you were prescribed an oral bowel prep to clean out your colon, take it as told by your health care provider.  During the colonoscopy, a flexible tube with a camera on its end is inserted into the anus and then passed into the other parts of the large intestine. This information is not intended to replace advice given to you by your health care provider. Make sure you discuss any questions you have with your health care provider. Document Revised: 07/12/2018 Document Reviewed: 07/12/2018 Elsevier Patient Education  [place] Maintenance, Female Adopting a healthy lifestyle and getting preventive care are important in promoting health and wellness. Ask your health care provider about:  The right schedule for you to have regular tests and exams.  Things you can do on your own to prevent diseases and keep yourself healthy. What should I know about diet, weight, and exercise? Eat a healthy  diet  Eat a diet that includes plenty of vegetables, fruits, low-fat dairy products, and lean protein.  Do not eat a lot of foods that are high in solid fats, added sugars, or sodium.   Maintain a healthy weight Body mass index (BMI) is used to identify weight problems. It estimates body fat based on height and weight. Your health care provider can help determine your BMI and help you achieve or maintain a healthy weight. Get regular exercise Get regular exercise. This is one of  the most important things you can do for your health. Most adults should:  Exercise for at least 150 minutes each week. The exercise should increase your heart rate and make you sweat (moderate-intensity exercise).  Do strengthening exercises at least twice a week. This is in addition to the moderate-intensity exercise.  Spend less time sitting. Even light physical activity can be beneficial. Watch cholesterol and blood lipids Have your blood tested for lipids and cholesterol at 73 years of age, then have this test every 5 years. Have your cholesterol levels checked more often if:  Your lipid or cholesterol levels are high.  You are older than 73 years of age.  You are at high risk for heart disease. What should I know about cancer screening? Depending on your health history and family history, you may need to have cancer screening at various ages. This may include screening for:  Breast cancer.  Cervical cancer.  Colorectal cancer.  Skin cancer.  Lung cancer. What should I know about heart disease, diabetes, and high blood pressure? Blood pressure and heart disease  High blood pressure causes heart disease and increases the risk of stroke. This is more likely to develop in people who have high blood pressure readings, are of African descent, or are overweight.  Have your blood pressure checked: ? Every 3-5 years if you are 29-95 years of age. ? Every year if you are 22 years old or  older. Diabetes Have regular diabetes screenings. This checks your fasting blood sugar level. Have the screening done:  Once every three years after age 35 if you are at a normal weight and have a low risk for diabetes.  More often and at a younger age if you are overweight or have a high risk for diabetes. What should I know about preventing infection? Hepatitis B If you have a higher risk for hepatitis B, you should be screened for this virus. Talk with your health care provider to find out if you are at risk for hepatitis B infection. Hepatitis C Testing is recommended for:  Everyone born from [birth year] through 1963.  Anyone with known risk factors for hepatitis C. Sexually transmitted infections (STIs)  Get screened for STIs, including gonorrhea and chlamydia, if: ? You are sexually active and are younger than 73 years of age. ? You are older than 73 years of age and your health care provider tells you that you are at risk for this type of infection. ? Your sexual activity has changed since you were last screened, and you are at increased risk for chlamydia or gonorrhea. Ask your health care provider if you are at risk.  Ask your health care provider about whether you are at high risk for HIV. Your health care provider may recommend a prescription medicine to help prevent HIV infection. If you choose to take medicine to prevent HIV, you should first get tested for HIV. You should then be tested every 3 months for as long as you are taking the medicine. Pregnancy  If you are about to stop having your period (premenopausal) and you may become pregnant, seek counseling before you get pregnant.  Take 400 to 800 micrograms (mcg) of folic acid every day if you become pregnant.  Ask for birth control (contraception) if you want to prevent pregnancy. Osteoporosis and menopause Osteoporosis is a disease in which the bones lose minerals and strength with aging. This can result in bone fractures.  If you are 24 years old or older, or  if you are at risk for osteoporosis and fractures, ask your health care provider if you should:  Be screened for bone loss.  Take a calcium or vitamin D supplement to lower your risk of fractures.  Be given hormone replacement therapy (HRT) to treat symptoms of menopause. Follow these instructions at home: Lifestyle  Do not use any products that contain nicotine or tobacco, such as cigarettes, e-cigarettes, and chewing tobacco. If you need help quitting, ask your health care provider.  Do not use street drugs.  Do not share needles.  Ask your health care provider for help if you need support or information about quitting drugs. Alcohol use  Do not drink alcohol if: ? Your health care provider tells you not to drink. ? You are pregnant, may be pregnant, or are planning to become pregnant.  If you drink alcohol: ? Limit how much you use to 0-1 drink a day. ? Limit intake if you are breastfeeding.  Be aware of how much alcohol is in your drink. In the [place] one drink equals one 12 oz bottle of beer (355 mL), one 5 oz glass of wine (148 mL), or one 1 oz glass of hard liquor (44 mL). General instructions  Schedule regular health, dental, and eye exams.  Stay current with your vaccines.  Tell your health care provider if: ? You often feel depressed. ? You have ever been abused or do not feel safe at home. Summary  Adopting a healthy lifestyle and getting preventive care are important in promoting health and wellness.  Follow your health care provider's instructions about healthy diet, exercising, and getting tested or screened for diseases.  Follow your health care provider's instructions on monitoring your cholesterol and blood pressure. This information is not intended to replace advice given to you by your health care provider. Make sure you discuss any questions you have with your health care provider. Document Revised: 12/12/2017  Document Reviewed: 12/12/2017 Elsevier Patient Education  2021 [place] A mammogram is a low energy X-ray of the breasts that is done to check for abnormal changes. This procedure can screen for and detect any changes that may indicate breast cancer. Mammograms are regularly done on women. A man may have a mammogram if he has a lump or swelling in his breast. A mammogram can also identify other changes and variations in the breast, such as:  Inflammation of the breast tissue (mastitis).  An infected area that contains a collection of pus (abscess).  A fluid-filled sac (cyst).  Fibrocystic changes. This is when breast tissue becomes denser, which can make the tissue feel rope-like or uneven under the skin.  Tumors that are not cancerous (benign). Tell a health care provider:  About any allergies you have.  If you have breast implants.  If you have had previous breast disease, biopsy, or surgery.  If you are breastfeeding.  If you are younger than age 45.  If you have a family history of breast cancer.  Whether you are pregnant or may be pregnant. What are the risks? Generally, this is a safe procedure. However, problems may occur, including:  Exposure to radiation. Radiation levels are very low with this test.  The results being misinterpreted.  The need for further tests.  The inability of the mammogram to detect certain cancers. What happens before the procedure?  Schedule your test about 1-2 weeks after your menstrual period if you are still menstruating. This is usually when your breasts are  the least tender.  If you have had a mammogram done at a different facility in the past, get the mammogram X-rays or have them sent to your current exam facility. The new and old images will be compared.  Wash your breasts and underarms on the day of the test.  Do not wear deodorants, perfumes, lotions, or powders anywhere on your body on the day of the  test.  Remove any jewelry from your neck.  Wear clothes that you can change into and out of easily. What happens during the procedure?  You will undress from the waist up and put on a gown that opens in the front.  You will stand in front of the X-ray machine.  Each breast will be placed between two plastic or glass plates. The plates will compress your breast for a few seconds. Try to stay as relaxed as possible during the procedure. This does not cause any harm to your breasts and any discomfort you feel will be very brief.  X-rays will be taken from different angles of each breast. The procedure may vary among health care providers and hospitals.   What happens after the procedure?  The mammogram will be examined by a specialist (radiologist).  You may need to repeat certain parts of the test, depending on the quality of the images. This is commonly done if the radiologist needs a better view of the breast tissue.  You may resume your normal activities.  It is up to you to get the results of your procedure. Ask your health care provider, or the department that is doing the procedure, when your results will be ready. Summary  A mammogram is a low energy X-ray of the breasts that is done to check for abnormal changes. A man may have a mammogram if he has a lump or swelling in his breast.  If you have had a mammogram done at a different facility in the past, get the mammogram X-rays or have them sent to your current exam facility in order to compare them.  Schedule your test about 1-2 weeks after your menstrual period if you are still menstruating.  For this test, each breast will be placed between two plastic or glass plates. The plates will compress your breast for a few seconds.  Ask when your test results will be ready. Make sure you get your test results. This information is not intended to replace advice given to you by your health care provider. Make sure you discuss any  questions you have with your health care provider. Document Revised: 08/09/2017 Document Reviewed: 08/09/2017 Elsevier Patient Education  2021 [place] Maintenance Summary and Written Plan of Care  Jill Bernard ,  Thank you for allowing me to perform your Medicare Annual Wellness Visit and for your ongoing commitment to your health.   Health Maintenance & Immunization History Health Maintenance  Topic Date Due  . COVID-19 Vaccine (1) 03/07/2020 (Originally 04/25/1950)  . INFLUENZA VACCINE  04/01/2020 (Originally 08/03/2019)  . PNA vac Low Risk Adult (2 of 2 - PCV13) 08/25/2020 (Originally 09/30/2013)  . MAMMOGRAM  11/06/2020 (Originally 04/25/1997)  . DEXA SCAN  11/06/2020 (Originally 04/25/2012)  . COLONOSCOPY (Pts 45-27yrs Insurance coverage will need to be confirmed)  11/06/2020 (Originally 04/25/1992)  . Hepatitis C Screening  11/06/2020 (Originally 1945/06/21)  . TETANUS/TDAP  08/25/2029   Immunization History  Administered Date(s) Administered  . Influenza,inj,Quad PF,6+ Mos 09/30/2012  . Influenza-Unspecified 09/30/2012  .  Pneumococcal Polysaccharide-23 09/30/2012  . Tdap 08/26/2019    These are the patient goals that we discussed: Goals Addressed              This Visit's Progress   .  Patient Stated (pt-stated)        02/20/2020 AWV Goal: Exercise for General Health   Patient will verbalize understanding of the benefits of increased physical activity:  Exercising regularly is important. It will improve your overall fitness, flexibility, and endurance.  Regular exercise also will improve your overall health. It can help you control your weight, reduce stress, and improve your bone density.  Over the next year, patient will increase physical activity as tolerated with a goal of at least 150 minutes of moderate physical activity per week.   You can tell that you are exercising at a moderate intensity if your heart starts  beating faster and you start breathing faster but can still hold a conversation.  Moderate-intensity exercise ideas include:  Walking 1 mile (1.6 km) in about 15 minutes  Biking  Hiking  Golfing  Dancing  Water aerobics  Patient will verbalize understanding of everyday activities that increase physical activity by providing examples like the following: ? Yard work, such as: ? Pushing a Conservation officer, nature ? Raking and bagging leaves ? Washing your car ? Pushing a stroller ? Shoveling snow ? Gardening ? Washing windows or floors  Patient will be able to explain general safety guidelines for exercising:   Before you start a new exercise program, talk with your health care provider.  Do not exercise so much that you hurt yourself, feel dizzy, or get very short of breath.  Wear comfortable clothes and wear shoes with good support.  Drink plenty of water while you exercise to prevent dehydration or heat stroke.  Work out until your breathing and your heartbeat get faster.         This is a list of Health Maintenance Items that are overdue or due now: Colonoscopy  Dexa scan Flu vaccine Covid vaccine Shingrix vaccine Pneumonia vaccine Mammogram Eye exam  Orders/Referrals Placed Today: No orders of the defined types were placed in this encounter.  (Contact our referral department at [phone] if you have not spoken with someone about your referral appointment within the next 5 days)    Follow-up Plan Follow-up with Emeterio Reeve, DO as planned

## 2020-02-29 ENCOUNTER — Other Ambulatory Visit: Payer: Self-pay | Admitting: Nurse Practitioner

## 2020-02-29 DIAGNOSIS — I10 Essential (primary) hypertension: Secondary | ICD-10-CM

## 2020-03-10 ENCOUNTER — Other Ambulatory Visit: Payer: Self-pay | Admitting: Nurse Practitioner

## 2020-03-10 DIAGNOSIS — I10 Essential (primary) hypertension: Secondary | ICD-10-CM

## 2020-03-17 ENCOUNTER — Other Ambulatory Visit: Payer: Self-pay

## 2020-03-17 DIAGNOSIS — I10 Essential (primary) hypertension: Secondary | ICD-10-CM

## 2020-03-17 MED ORDER — AMLODIPINE BESYLATE 10 MG PO TABS: ORAL_TABLET | ORAL | 1 refills | Status: AC

## 2020-03-17 MED ORDER — CHLORTHALIDONE 25 MG PO TABS: ORAL_TABLET | ORAL | 1 refills | Status: AC

## 2020-04-01 ENCOUNTER — Other Ambulatory Visit: Payer: Self-pay

## 2020-04-01 ENCOUNTER — Ambulatory Visit: Payer: Medicare Other | Admitting: Osteopathic Medicine

## 2020-04-01 ENCOUNTER — Ambulatory Visit (INDEPENDENT_AMBULATORY_CARE_PROVIDER_SITE_OTHER): Payer: Medicare Other | Admitting: Osteopathic Medicine

## 2020-04-01 ENCOUNTER — Encounter: Payer: Self-pay | Admitting: Osteopathic Medicine

## 2020-04-01 VITALS — BP 161/65 | HR 88 | Temp 98.4°F | Wt 144.0 lb

## 2020-04-01 DIAGNOSIS — L989 Disorder of the skin and subcutaneous tissue, unspecified: Secondary | ICD-10-CM

## 2020-04-01 NOTE — Progress Notes (Signed)
Jill Bernard is a 75 y.o. female who presents to  [place] at [facility]  today, 04/01/20, seeking care for the following:  . Skin check: concern for spot on L cheek ongoing a few months, irritated, she scratches at it, doesn't seem to be healing.          ASSESSMENT & PLAN with other pertinent findings:  The encounter diagnosis was Lesion of skin of face. Ddx SCC, abnormal seborrheic keratosis, other. Concern for malignancy since non-healing, but pt also scratching at this.   --> derm referral - opinion re: surgery vs cryo vs Rx vs other     There are no Patient Instructions on file for this visit.  Orders Placed This Encounter  Procedures  . Ambulatory referral to Dermatology    No orders of the defined types were placed in this encounter.    See below for relevant physical exam findings  See below for recent lab and imaging results reviewed  Medications, allergies, PMH, PSH, SocH, [place] reviewed below    Follow-up instructions: Return for annual physical in 08/2020.                                        Exam:  BP (!) 161/65 (BP Location: Left Arm, Patient Position: Sitting, Cuff Size: Normal)   Pulse 88   Temp 98.4 F (36.9 C) (Oral)   Wt 144 lb 0.6 oz (65.3 kg)   BMI 26.35 kg/m  Pt didn't take BP meds today   Constitutional: VS see above. General Appearance: alert, well-developed, well-nourished, NAD  Neck: No masses, trachea midline.   Respiratory: Normal respiratory effort. no wheeze, no rhonchi, no rales  Cardiovascular: S1/S2 normal, no murmur, no rub/gallop auscultated. RRR.   Musculoskeletal: Gait normal.  Neurological: Normal balance/coordination. No tremor.  Skin: see photo   Psychiatric: Normal judgment/insight. Normal mood and affect. Oriented x3.   Current Meds  Medication Sig  . AMBULATORY NON FORMULARY MEDICATION Blood pressure monitor.  Marland Kitchen  amLODipine (NORVASC) 10 MG tablet TAKE 1 TABLET(10 MG) BY MOUTH DAILY  . chlorthalidone (HYGROTON) 25 MG tablet TAKE 1 TABLET(25 MG) BY MOUTH DAILY  . diclofenac Sodium (VOLTAREN) 1 % GEL Apply 1 application topically 4 (four) times daily as needed.  . gabapentin (NEURONTIN) 300 MG capsule One tab PO qHS for a week, then BID for a week, then TID. May double weekly to a max of 3,600mg /day  . Multiple Vitamin (MULTIVITAMIN WITH MINERALS) TABS tablet Take 1 tablet by mouth daily.  . rosuvastatin (CRESTOR) 20 MG tablet TAKE 1/2 TABLET BY MOUTH AT BEDTIME FOR 8 DAYS, THEN INCREASE TO 1 TABLET AT BEDTIME    Allergies  Allergen Reactions  . Latex Other (See Comments)    Unknown  . Lisinopril Cough    Patient Active Problem List   Diagnosis Date Noted  . Primary osteoarthritis of left knee 11/07/2019  . Nocturnal leg cramps 08/26/2019  . Essential hypertension 10/25/2016  . Left lumbar radiculopathy 10/25/2016  . Glaucoma 10/25/2016  . Hyperlipidemia with target low density lipoprotein (LDL) cholesterol less than 130 mg/dL 10/25/2016  . Insomnia, unspecified 10/25/2016  . Fatigue 06/30/2014  . Syncope 06/30/2014  . Bruit of right carotid artery 07/29/2013  . Dyslipidemia 07/29/2013  . Epigastric pain 07/29/2013  . Gastroesophageal reflux disease without esophagitis 07/29/2013  . Primary insomnia 07/29/2013  . Vitamin D  deficiency 10/01/2012    Family History  Problem Relation Age of Onset  . Hypertension Father   . Diabetes Maternal Grandmother   . Diabetes Maternal Grandfather     Social History   Tobacco Use  Smoking Status Never Smoker  Smokeless Tobacco Never Used    Past Surgical History:  Procedure Laterality Date  . NO PAST SURGERIES      Immunization History  Administered Date(s) Administered  . Influenza,inj,Quad PF,6+ Mos 09/30/2012  . Influenza-Unspecified 09/30/2012  . Pneumococcal Polysaccharide-23 09/30/2012  . Tdap 08/26/2019    No results found for  this or any previous visit (from the past 2160 hour(s)).  No results found.     All questions at time of visit were answered - patient instructed to contact office with any additional concerns or updates. ER/RTC precautions were reviewed with the patient as applicable.   Please note: manual typing as well as voice recognition software may have been used to produce this document - typos may escape review. Please contact Dr. Sheppard Coil for any needed clarifications.

## 2020-06-22 ENCOUNTER — Other Ambulatory Visit: Payer: Self-pay | Admitting: Nurse Practitioner

## 2020-06-22 DIAGNOSIS — E782 Mixed hyperlipidemia: Secondary | ICD-10-CM

## 2020-07-01 ENCOUNTER — Other Ambulatory Visit: Payer: Self-pay

## 2020-07-01 DIAGNOSIS — E782 Mixed hyperlipidemia: Secondary | ICD-10-CM

## 2020-07-01 MED ORDER — ROSUVASTATIN CALCIUM 20 MG PO TABS: ORAL_TABLET | ORAL | 0 refills | Status: AC

## 2020-09-09 ENCOUNTER — Other Ambulatory Visit: Payer: Self-pay

## 2020-09-09 DIAGNOSIS — M5416 Radiculopathy, lumbar region: Secondary | ICD-10-CM

## 2020-09-09 MED ORDER — GABAPENTIN 300 MG PO CAPS: ORAL_CAPSULE | ORAL | 1 refills | Status: DC

## 2020-12-15 ENCOUNTER — Other Ambulatory Visit: Payer: Self-pay | Admitting: Osteopathic Medicine

## 2020-12-15 DIAGNOSIS — M5416 Radiculopathy, lumbar region: Secondary | ICD-10-CM

## 2021-02-01 ENCOUNTER — Other Ambulatory Visit: Payer: Self-pay | Admitting: Family Medicine

## 2021-02-01 DIAGNOSIS — M5416 Radiculopathy, lumbar region: Secondary | ICD-10-CM

## 2021-02-02 NOTE — Telephone Encounter (Signed)
Needs follow-up

## 2021-03-17 ENCOUNTER — Other Ambulatory Visit: Payer: Self-pay | Admitting: Physician Assistant

## 2021-03-17 DIAGNOSIS — M5416 Radiculopathy, lumbar region: Secondary | ICD-10-CM

## 2021-03-23 ENCOUNTER — Other Ambulatory Visit: Payer: Self-pay

## 2021-03-23 DIAGNOSIS — M5416 Radiculopathy, lumbar region: Secondary | ICD-10-CM

## 2021-03-23 MED ORDER — GABAPENTIN 300 MG PO CAPS: ORAL_CAPSULE | ORAL | 0 refills | Status: AC

## 2023-01-26 DIAGNOSIS — R7303 Prediabetes: Secondary | ICD-10-CM | POA: Diagnosis not present

## 2023-01-26 DIAGNOSIS — I10 Essential (primary) hypertension: Secondary | ICD-10-CM | POA: Diagnosis not present

## 2023-01-26 DIAGNOSIS — E785 Hyperlipidemia, unspecified: Secondary | ICD-10-CM | POA: Diagnosis not present
